# Patient Record
Sex: Male | Born: 1960 | Race: White | Hispanic: No | Marital: Married | Smoking: Never smoker
Health system: Southern US, Community
[De-identification: ages and names within clinical notes are randomized; demographics above are authoritative.]

## PROBLEM LIST (undated history)

## (undated) DIAGNOSIS — I10 Essential (primary) hypertension: Secondary | ICD-10-CM

## (undated) DIAGNOSIS — M519 Unspecified thoracic, thoracolumbar and lumbosacral intervertebral disc disorder: Secondary | ICD-10-CM

## (undated) DIAGNOSIS — K219 Gastro-esophageal reflux disease without esophagitis: Secondary | ICD-10-CM

## (undated) DIAGNOSIS — K317 Polyp of stomach and duodenum: Secondary | ICD-10-CM

## (undated) DIAGNOSIS — D369 Benign neoplasm, unspecified site: Secondary | ICD-10-CM

## (undated) DIAGNOSIS — J302 Other seasonal allergic rhinitis: Secondary | ICD-10-CM

## (undated) DIAGNOSIS — K760 Fatty (change of) liver, not elsewhere classified: Secondary | ICD-10-CM

## (undated) DIAGNOSIS — F419 Anxiety disorder, unspecified: Secondary | ICD-10-CM

## (undated) HISTORY — DX: Fatty (change of) liver, not elsewhere classified: K76.0

## (undated) HISTORY — DX: Benign neoplasm, unspecified site: D36.9

## (undated) HISTORY — DX: Polyp of stomach and duodenum: K31.7

## (undated) HISTORY — DX: Unspecified thoracic, thoracolumbar and lumbosacral intervertebral disc disorder: M51.9

## (undated) HISTORY — DX: Essential (primary) hypertension: I10

## (undated) SURGERY — ERCP, WITH INTERVENTION IF INDICATED
Anesthesia: General | Laterality: Left

---

## 2000-02-06 HISTORY — PX: BACK SURGERY: SHX140

## 2000-03-18 ENCOUNTER — Emergency Department (HOSPITAL_COMMUNITY): Admission: EM | Admit: 2000-03-18 | Discharge: 2000-03-19 | Payer: Self-pay | Admitting: *Deleted

## 2000-04-04 ENCOUNTER — Encounter: Payer: Self-pay | Admitting: Neurosurgery

## 2000-04-04 ENCOUNTER — Ambulatory Visit (HOSPITAL_COMMUNITY): Admission: RE | Admit: 2000-04-04 | Discharge: 2000-04-04 | Payer: Self-pay | Admitting: Neurosurgery

## 2000-05-13 ENCOUNTER — Encounter (HOSPITAL_COMMUNITY): Admission: RE | Admit: 2000-05-13 | Discharge: 2000-06-12 | Payer: Self-pay | Admitting: Neurosurgery

## 2002-10-15 ENCOUNTER — Encounter: Payer: Self-pay | Admitting: Otolaryngology

## 2002-10-15 ENCOUNTER — Ambulatory Visit (HOSPITAL_COMMUNITY): Admission: RE | Admit: 2002-10-15 | Discharge: 2002-10-15 | Payer: Self-pay | Admitting: Otolaryngology

## 2004-09-26 ENCOUNTER — Ambulatory Visit (HOSPITAL_COMMUNITY): Admission: RE | Admit: 2004-09-26 | Discharge: 2004-09-26 | Payer: Self-pay | Admitting: Internal Medicine

## 2005-03-14 ENCOUNTER — Ambulatory Visit (HOSPITAL_COMMUNITY): Admission: RE | Admit: 2005-03-14 | Discharge: 2005-03-14 | Payer: Self-pay | Admitting: Internal Medicine

## 2005-09-19 ENCOUNTER — Ambulatory Visit (HOSPITAL_COMMUNITY): Admission: RE | Admit: 2005-09-19 | Discharge: 2005-09-19 | Payer: Self-pay | Admitting: Internal Medicine

## 2011-03-07 ENCOUNTER — Telehealth (INDEPENDENT_AMBULATORY_CARE_PROVIDER_SITE_OTHER): Payer: Self-pay | Admitting: *Deleted

## 2011-03-07 ENCOUNTER — Other Ambulatory Visit (INDEPENDENT_AMBULATORY_CARE_PROVIDER_SITE_OTHER): Payer: Self-pay | Admitting: *Deleted

## 2011-03-07 DIAGNOSIS — Z1211 Encounter for screening for malignant neoplasm of colon: Secondary | ICD-10-CM

## 2011-03-07 NOTE — Telephone Encounter (Signed)
Patient needs movi prep 

## 2011-03-09 MED ORDER — PEG-KCL-NACL-NASULF-NA ASC-C 100 G PO SOLR: 1.0000 | Freq: Once | ORAL | Status: DC

## 2011-04-24 ENCOUNTER — Telehealth (INDEPENDENT_AMBULATORY_CARE_PROVIDER_SITE_OTHER): Payer: Self-pay | Admitting: *Deleted

## 2011-04-24 NOTE — Telephone Encounter (Signed)
Requesting MD/PCP:  fagan     Name & DOB: Jacob Douglas 07/13/2060     Procedure: TCS  Reason/Indication:  screening  Has patient had this procedure before?  no  If so, when, by whom and where?    Is there a family history of colon cancer?  no  Who?  What age when diagnosed?    Is patient diabetic?   no      Does patient have prosthetic heart valve?  no  Do you have a pacemaker?  no  Has patient had joint replacement within last 12 months?  no  Is patient on Coumadin, Plavix and/or Aspirin? no  Medications: protonix 40 mg daily, lotrel 5/20 mg daily  Allergies: pcn  Pharmacy: [facility]  Medication Adjustment: none  Procedure date & time: 05/17/11 at 830

## 2011-04-26 NOTE — Telephone Encounter (Signed)
agree

## 2011-05-16 ENCOUNTER — Encounter (HOSPITAL_COMMUNITY): Payer: Self-pay | Admitting: Pharmacy Technician

## 2011-05-16 MED ORDER — SODIUM CHLORIDE 0.45 % IV SOLN
Freq: Once | INTRAVENOUS | Status: AC
  Administered 2011-05-17: 08:00:00 via INTRAVENOUS

## 2011-05-17 ENCOUNTER — Encounter (HOSPITAL_COMMUNITY): Payer: Self-pay | Admitting: *Deleted

## 2011-05-17 ENCOUNTER — Encounter (HOSPITAL_COMMUNITY)
Admission: RE | Disposition: A | Discharge status: Home / Self Care | Payer: Self-pay | Source: Ambulatory Visit | Attending: Internal Medicine

## 2011-05-17 ENCOUNTER — Ambulatory Visit (HOSPITAL_COMMUNITY)
Admission: RE | Admit: 2011-05-17 | Discharge: 2011-05-17 | Disposition: A | Discharge status: Home / Self Care | Payer: Managed Care, Other (non HMO) | Source: Ambulatory Visit | Attending: Internal Medicine | Admitting: Internal Medicine

## 2011-05-17 DIAGNOSIS — Z79899 Other long term (current) drug therapy: Secondary | ICD-10-CM | POA: Insufficient documentation

## 2011-05-17 DIAGNOSIS — I10 Essential (primary) hypertension: Secondary | ICD-10-CM | POA: Insufficient documentation

## 2011-05-17 DIAGNOSIS — D126 Benign neoplasm of colon, unspecified: Secondary | ICD-10-CM

## 2011-05-17 DIAGNOSIS — Z1211 Encounter for screening for malignant neoplasm of colon: Secondary | ICD-10-CM | POA: Insufficient documentation

## 2011-05-17 HISTORY — DX: Gastro-esophageal reflux disease without esophagitis: K21.9

## 2011-05-17 HISTORY — PX: COLONOSCOPY: SHX5424

## 2011-05-17 HISTORY — DX: Essential (primary) hypertension: I10

## 2011-05-17 HISTORY — DX: Other seasonal allergic rhinitis: J30.2

## 2011-05-17 SURGERY — COLONOSCOPY
Anesthesia: Moderate Sedation

## 2011-05-17 MED ORDER — MEPERIDINE HCL 50 MG/ML IJ SOLN
INTRAMUSCULAR | Status: DC | PRN
  Administered 2011-05-17 (×2): 25 mg via INTRAVENOUS

## 2011-05-17 MED ORDER — MIDAZOLAM HCL 5 MG/5ML IJ SOLN
INTRAMUSCULAR | Status: AC
  Filled 2011-05-17: qty 10

## 2011-05-17 MED ORDER — STERILE WATER FOR IRRIGATION IR SOLN
Status: DC | PRN
  Administered 2011-05-17: 09:00:00

## 2011-05-17 MED ORDER — MEPERIDINE HCL 50 MG/ML IJ SOLN
INTRAMUSCULAR | Status: AC
  Filled 2011-05-17: qty 1

## 2011-05-17 MED ORDER — MIDAZOLAM HCL 5 MG/5ML IJ SOLN
INTRAMUSCULAR | Status: DC | PRN
  Administered 2011-05-17 (×4): 2 mg via INTRAVENOUS

## 2011-05-17 NOTE — Op Note (Signed)
COLONOSCOPY PROCEDURE REPORT  PATIENT:  Jacob Douglas  MR#:  [id number] Birthdate:  04-18-1960, 51 y.o., male Endoscopist:  Dr. Malissa Hippo, MD Referred By:  Dr. Carylon Perches, MD Procedure Date: 05/17/2011  Procedure:   Colonoscopy with snare polypectomy.  Indications:  Patient is 51 year old Caucasian male who is here for average risk screening colonoscopy.  Informed Consent:  The procedure and risks were reviewed with the patient and informed consent was obtained.  Medications:  Demerol 50 mg IV Versed 8 mg IV  Description of procedure:  After a digital rectal exam was performed, that colonoscope was advanced from the anus through the rectum and colon to the area of the cecum, ileocecal valve and appendiceal orifice. The cecum was deeply intubated. These structures were well-seen and photographed for the record. From the level of the cecum and ileocecal valve, the scope was slowly and cautiously withdrawn. The mucosal surfaces were carefully surveyed utilizing scope tip to flexion to facilitate fold flattening as needed. The scope was pulled down into the rectum where a thorough exam including retroflexion was performed.  Findings:   Prep excellent. About 15 x 10 mm sessile polyp at ileocecal valve. Piecemeal polypectomy performed with removal of 2 pieces and rest of the polyp was coagulated using snare tip. Two small polyps at the ascending colon were coagulated using snare tip. Normal rectal mucosa and anal rectal junction.  Therapeutic/Diagnostic Maneuvers Performed:  See above  Complications:  None  Cecal Withdrawal Time:  22 minutes  Impression:  Examination performed to cecum. 15 x 10 mm sessile polyp at ileocecal valve. Treated with combination of polypectomy and coagulation. Polypectomy complete. Two small polyps at ascending colon are correlated using snare tip.  Recommendations:  Standard instructions given. I will be contacting patient with results of biopsy and  further recommendations.  Zuleyka Kloc U  05/17/2011 9:15 AM  CC: Dr. Carylon Perches, MD, MD & Dr. Bonnetta Barry ref. provider found

## 2011-05-17 NOTE — H&P (Signed)
WILBORN MEMBRENO is an 51 y.o. male.   Chief Complaint: Patient is here for colonoscopy. HPI: Patient is 51 year old Caucasian male who is in for average risk screening colonoscopy. He denies abdominal pain change in bowel habits or rectal bleeding. Family history is negative for colorectal carcinoma.  Past Medical History  Diagnosis Date  . Seasonal allergies   . Hypertension   . GERD (gastroesophageal reflux disease)     Past Surgical History  Procedure Date  . Back surgery 2002    Family History  Problem Relation Age of Onset  . Colon cancer Neg Hx    Social History:  reports that he has never smoked. He does not have any smokeless tobacco history on file. He reports that he drinks about 2 ounces of alcohol per week. He reports that he does not use illicit drugs.  Allergies:  Allergies  Allergen Reactions  . Penicillins Rash    Medications Prior to Admission  Medication Dose Route Frequency Provider Last Rate Last Dose  . 0.45 % sodium chloride infusion   Intravenous Once Malissa Hippo, MD 20 mL/hr at 05/17/11 0755    . meperidine (DEMEROL) 50 MG/ML injection           . midazolam (VERSED) 5 MG/5ML injection            Medications Prior to Admission  Medication Sig Dispense Refill  . ALPRAZolam (XANAX) 0.5 MG tablet Take 0.5 mg by mouth 2 (two) times daily as needed. Patient takes 1/2 tablet if needed, which is very rarely      . amLODipine-benazepril (LOTREL) 5-20 MG per capsule Take 1 capsule by mouth daily.      . Multiple Vitamin (MULITIVITAMIN WITH MINERALS) TABS Take 1 tablet by mouth daily.      . pantoprazole (PROTONIX) 40 MG tablet Take 40 mg by mouth daily.      . peg 3350 powder (MOVIPREP) 100 G SOLR Take 1 kit (100 g total) by mouth once.  1 kit  0    No results found for this or any previous visit (from the past 48 hour(s)). No results found.  Review of Systems  Constitutional: Negative for weight loss.  Gastrointestinal: Negative for abdominal pain,  diarrhea, constipation, blood in stool and melena.    Blood pressure 134/90, pulse 57, temperature 98 F (36.7 C), temperature source Oral, resp. rate 14, height 5\' 9"  (1.753 m), weight 185 lb (83.915 kg), SpO2 94.00%. Physical Exam  Constitutional: He appears well-developed and well-nourished.  HENT:  Mouth/Throat: Oropharynx is clear and moist.  Eyes: Conjunctivae are normal. No scleral icterus.  Neck: No thyromegaly present.  Cardiovascular: Normal rate, regular rhythm and normal heart sounds.   No murmur heard. Respiratory: Effort normal and breath sounds normal.  GI: Soft. He exhibits no distension and no mass. There is no tenderness.  Musculoskeletal: He exhibits no edema.  Lymphadenopathy:    He has no cervical adenopathy.  Neurological: He is alert.  Skin: Skin is warm and dry.     Assessment/Plan Average risk screening colonoscopy  Takyia Sindt U 05/17/2011, 8:34 AM

## 2011-05-17 NOTE — Discharge Instructions (Signed)
Resume usual medications and diet. No aspirin for 10 days. Physician will contact you results of biopsy and further recommendations  Colonoscopy Care After Read the instructions outlined below and refer to this sheet in the next few weeks. These discharge instructions provide you with general information on caring for yourself after you leave the hospital. Your doctor may also give you specific instructions. While your treatment has been planned according to the most current medical practices available, unavoidable complications occasionally occur. If you have any problems or questions after discharge, call your doctor. HOME CARE INSTRUCTIONS ACTIVITY:  You may resume your regular activity, but move at a slower pace for the next 24 hours.   Take frequent rest periods for the next 24 hours.   Walking will help get rid of the air and reduce the bloated feeling in your belly (abdomen).   No driving for 24 hours (because of the medicine (anesthesia) used during the test).   You may shower.   Do not sign any important legal documents or operate any machinery for 24 hours (because of the anesthesia used during the test).  NUTRITION:  Drink plenty of fluids.   You may resume your normal diet as instructed by your doctor.   Begin with a light meal and progress to your normal diet. Heavy or fried foods are harder to digest and may make you feel sick to your stomach (nauseated).   Avoid alcoholic beverages for 24 hours or as instructed.  MEDICATIONS:  You may resume your normal medications unless your doctor tells you otherwise.  WHAT TO EXPECT TODAY:  Some feelings of bloating in the abdomen.   Passage of more gas than usual.   Spotting of blood in your stool or on the toilet paper.  IF YOU HAD POLYPS REMOVED DURING THE COLONOSCOPY:  No aspirin products for 7 days or as instructed.   No alcohol for 7 days or as instructed.   Eat a soft diet for the next 24 hours.  FINDING OUT  THE RESULTS OF YOUR TEST Not all test results are available during your visit. If your test results are not back during the visit, make an appointment with your caregiver to find out the results. Do not assume everything is normal if you have not heard from your caregiver or the medical facility. It is important for you to follow up on all of your test results.  SEEK IMMEDIATE MEDICAL CARE IF:  You have more than a spotting of blood in your stool.   Your belly is swollen (abdominal distention).   You are nauseated or vomiting.   You have a fever.   You have abdominal pain or discomfort that is severe or gets worse throughout the day.  Document Released: 09/06/2003 Document Revised: 01/11/2011 Document Reviewed: 09/04/2007 [facility] Patient Information 2012 [place], [place].

## 2011-05-22 ENCOUNTER — Encounter (INDEPENDENT_AMBULATORY_CARE_PROVIDER_SITE_OTHER): Payer: Self-pay | Admitting: *Deleted

## 2011-05-23 ENCOUNTER — Encounter (HOSPITAL_COMMUNITY): Payer: Self-pay | Admitting: Internal Medicine

## 2011-06-25 ENCOUNTER — Emergency Department (HOSPITAL_COMMUNITY): Payer: Managed Care, Other (non HMO)

## 2011-06-25 ENCOUNTER — Encounter (HOSPITAL_COMMUNITY): Payer: Self-pay

## 2011-06-25 ENCOUNTER — Emergency Department (HOSPITAL_COMMUNITY)
Admission: EM | Admit: 2011-06-25 | Discharge: 2011-06-25 | Disposition: A | Discharge status: Home / Self Care | Payer: Managed Care, Other (non HMO) | Attending: Emergency Medicine | Admitting: Emergency Medicine

## 2011-06-25 DIAGNOSIS — R0789 Other chest pain: Secondary | ICD-10-CM

## 2011-06-25 DIAGNOSIS — K219 Gastro-esophageal reflux disease without esophagitis: Secondary | ICD-10-CM | POA: Insufficient documentation

## 2011-06-25 DIAGNOSIS — R110 Nausea: Secondary | ICD-10-CM | POA: Insufficient documentation

## 2011-06-25 DIAGNOSIS — I10 Essential (primary) hypertension: Secondary | ICD-10-CM | POA: Insufficient documentation

## 2011-06-25 DIAGNOSIS — R079 Chest pain, unspecified: Secondary | ICD-10-CM | POA: Insufficient documentation

## 2011-06-25 DIAGNOSIS — M549 Dorsalgia, unspecified: Secondary | ICD-10-CM | POA: Insufficient documentation

## 2011-06-25 LAB — CBC
Hemoglobin: 15 g/dL (ref 13.0–17.0)
MCH: 30 pg (ref 26.0–34.0)
MCHC: 33.1 g/dL (ref 30.0–36.0)
RDW: 13 % (ref 11.5–15.5)

## 2011-06-25 LAB — DIFFERENTIAL
Basophils Absolute: 0.1 10*3/uL (ref 0.0–0.1)
Basophils Relative: 1 % (ref 0–1)
Eosinophils Absolute: 0.1 10*3/uL (ref 0.0–0.7)
Monocytes Absolute: 1.1 10*3/uL — ABNORMAL HIGH (ref 0.1–1.0)
Monocytes Relative: 11 % (ref 3–12)
Neutrophils Relative %: 53 % (ref 43–77)

## 2011-06-25 LAB — BASIC METABOLIC PANEL
Calcium: 9.6 mg/dL (ref 8.4–10.5)
GFR calc Af Amer: >90 mL/min (ref >90)
GFR calc non Af Amer: 85 mL/min — ABNORMAL LOW (ref >90)
Glucose, Bld: 111 mg/dL — ABNORMAL HIGH (ref 70–99)
Potassium: 3.6 mEq/L (ref 3.5–5.1)
Sodium: 140 mEq/L (ref 135–145)

## 2011-06-25 MED ORDER — SODIUM CHLORIDE 0.9 % IV SOLN
Freq: Once | INTRAVENOUS | Status: AC
  Administered 2011-06-25: 04:00:00 via INTRAVENOUS

## 2011-06-25 MED ORDER — IBUPROFEN 800 MG PO TABS: 800.0000 mg | ORAL_TABLET | Freq: Three times a day (TID) | ORAL | Status: AC

## 2011-06-25 MED ORDER — HYDROCODONE-ACETAMINOPHEN 5-325 MG PO TABS: 1.0000 | ORAL_TABLET | ORAL | Status: AC | PRN

## 2011-06-25 MED ORDER — ASPIRIN 81 MG PO CHEW
324.0000 mg | CHEWABLE_TABLET | Freq: Once | ORAL | Status: AC
  Administered 2011-06-25: 324 mg via ORAL
  Filled 2011-06-25: qty 4

## 2011-06-25 MED ORDER — MORPHINE SULFATE 4 MG/ML IJ SOLN
2.0000 mg | Freq: Once | INTRAMUSCULAR | Status: AC
  Administered 2011-06-25: 2 mg via INTRAVENOUS
  Filled 2011-06-25: qty 1

## 2011-06-25 MED ORDER — ONDANSETRON HCL 4 MG/2ML IJ SOLN
4.0000 mg | Freq: Once | INTRAMUSCULAR | Status: AC
  Administered 2011-06-25: 4 mg via INTRAVENOUS
  Filled 2011-06-25: qty 2

## 2011-06-25 NOTE — ED Notes (Signed)
Patient denies pain and is resting comfortably.  

## 2011-06-25 NOTE — Discharge Instructions (Signed)
Your blood work was normal here tonight including your heart numbers. Your EKG and chest xray were normal. Apply heat to the area for comfort. Use the ibuprofen and the stronger pain medicine if needed. Follow up with your doctor.   Chest Pain (Nonspecific) Chest pain has many causes. Your pain could be caused by something serious, such as a heart attack or a blood clot in the lungs. It could also be caused by something less serious, such as a chest bruise or a virus. Follow up with your doctor. More lab tests or other studies may be needed to find the cause of your pain. Most of the time, nonspecific chest pain will improve within 2 to 3 days of rest and mild pain medicine. HOME CARE  For chest bruises, you may put ice on the sore area for 15 to 20 minutes, 3 to 4 times a day. Do this only if it makes you or your child feel better.   Put ice in a plastic bag.   Place a towel between the skin and the bag.   Rest for the next 2 to 3 days.   Go back to work if the pain improves.   See your doctor if the pain lasts longer than 1 to 2 weeks.   Only take medicine as told by your doctor.   Quit smoking if you smoke.  GET HELP RIGHT AWAY IF:   There is more pain or pain that spreads to the arm, neck, jaw, back, or belly (abdomen).   You or your child has shortness of breath.   You or your child coughs more than usual or coughs up blood.   You or your child has very bad back or belly pain, feels sick to his or her stomach (nauseous), or throws up (vomits).   You or your child has very bad weakness.   You or your child passes out (faints).   You or your child has a temperature by mouth above 102 F (38.9 C), not controlled by medicine.  Any of these problems may be serious and may be an emergency. Do not wait to see if the problems will go away. Get medical help right away. Call your local emergency services 911 in [place] Do not drive yourself to the hospital. MAKE SURE YOU:    Understand these instructions.   Will watch this condition.   Will get help right away if you or your child is not doing well or gets worse.  Document Released: 07/11/2007 Document Revised: 01/11/2011 Document Reviewed: 07/11/2007 [facility] Patient Information 2012 [place], [place].

## 2011-06-25 NOTE — ED Notes (Signed)
Pt c/o chest pain that woke him up 1hr PTA also has complaints of SOB and nausea. Pain moves thru to back

## 2011-06-25 NOTE — ED Provider Notes (Signed)
History     CSN: [id number]  Arrival date & time 06/25/11  4098   First MD Initiated Contact with Patient 06/25/11 [phone]      Chief Complaint  Patient presents with  . Chest Pain    (Consider location/radiation/quality/duration/timing/severity/associated sxs/prior treatment) HPI  Jacob Douglas is a 51 y.o. male who presents to the Emergency Department complaining of  Right sided chest pain that woke him from sleep associated with nausea. Pain radiated to his back. He has a history of GERD and is on protonx. He took Prilosec without relief.The only physical activity he can recall in the last 24 hours was pulling weeds in his yard for 2 hours yesterday. Denies fever, chills, cough,shortness of breath, diarrhea.  PCP Dr. Ouida Sills Past Medical History  Diagnosis Date  . Seasonal allergies   . Hypertension   . GERD (gastroesophageal reflux disease)     Past Surgical History  Procedure Date  . Back surgery 2002  . Colonoscopy 05/17/2011    Procedure: COLONOSCOPY;  Surgeon: Malissa Hippo, MD;  Location: [facility];  Service: Endoscopy;  Laterality: N/A;  830    Family History  Problem Relation Age of Onset  . Colon cancer Neg Hx     History  Substance Use Topics  . Smoking status: Never Smoker   . Smokeless tobacco: Not on file  . Alcohol Use: 2.0 oz/week    4 drink(s) per week      Review of Systems  Constitutional: Negative for fever.       10 Systems reviewed and are negative for acute change except as noted in the HPI.  HENT: Negative for congestion.   Eyes: Negative for discharge and redness.  Respiratory: Negative for cough and shortness of breath.   Cardiovascular: Positive for chest pain.  Gastrointestinal: Positive for nausea. Negative for vomiting and abdominal pain.  Musculoskeletal: Negative for back pain.  Skin: Negative for rash.  Neurological: Negative for syncope, numbness and headaches.  Psychiatric/Behavioral:       No behavior change.     Allergies  Penicillins  Home Medications   Current Outpatient Rx  Name Route Sig Dispense Refill  . ALPRAZOLAM 0.5 MG PO TABS Oral Take 0.5 mg by mouth 2 (two) times daily as needed. Patient takes 1/2 tablet if needed, which is very rarely    . AMLODIPINE BESY-BENAZEPRIL HCL 5-20 MG PO CAPS Oral Take 1 capsule by mouth daily.    . ADULT MULTIVITAMIN W/MINERALS CH Oral Take 1 tablet by mouth daily.    Marland Kitchen PANTOPRAZOLE SODIUM 40 MG PO TBEC Oral Take 40 mg by mouth daily.      BP 156/106  Temp(Src) 98 F (36.7 C) (Oral)  Resp 16  Ht 5\' 9"  (1.753 m)  Wt 185 lb (83.915 kg)  BMI 27.32 kg/m2  SpO2 96%  Physical Exam  Nursing note and vitals reviewed. Constitutional:       Awake, alert, nontoxic appearance.  HENT:  Head: Atraumatic.  Eyes: Right eye exhibits no discharge. Left eye exhibits no discharge.  Neck: Neck supple.  Cardiovascular: Normal rate, normal heart sounds and intact distal pulses.   Pulmonary/Chest: Effort normal. No respiratory distress. He has no wheezes. He exhibits no tenderness.  Abdominal: Soft. There is no tenderness. There is no rebound.  Musculoskeletal: He exhibits no tenderness.       Baseline ROM, no obvious new focal weakness.  Neurological:       Mental status and motor strength appears baseline  for patient and situation.  Skin: No rash noted.  Psychiatric: He has a normal mood and affect.    ED Course  Procedures (including critical care time)   Date: 06/25/2011  0344  Rate:70 Rhythm: normal sinus rhythm with sinus arrhythmia  QRS Axis: normal  Intervals: normal  ST/T Wave abnormalities: normal  Conduction Disutrbances: none  Narrative Interpretation: unremarkable  Results for orders placed during the hospital encounter of 06/25/11  CBC      Component Value Range   WBC 10.3  4.0 - 10.5 (K/uL)   RBC 5.00  4.22 - 5.81 (MIL/uL)   Hemoglobin 15.0  13.0 - 17.0 (g/dL)   HCT 16.1  09.6 - 04.5 (%)   MCV 90.6  78.0 - 100.0 (fL)   MCH 30.0   26.0 - 34.0 (pg)   MCHC 33.1  30.0 - 36.0 (g/dL)   RDW 40.9  81.1 - 91.4 (%)   Platelets 346  150 - 400 (K/uL)  BASIC METABOLIC PANEL      Component Value Range   Sodium 140  135 - 145 (mEq/L)   Potassium 3.6  3.5 - 5.1 (mEq/L)   Chloride 101  96 - 112 (mEq/L)   CO2 25  19 - 32 (mEq/L)   Glucose, Bld 111 (*) 70 - 99 (mg/dL)   BUN 14  6 - 23 (mg/dL)   Creatinine, Ser 7.82  0.50 - 1.35 (mg/dL)   Calcium 9.6  8.4 - 95.6 (mg/dL)   GFR calc non Af Amer 85 (*) >90 (mL/min)   GFR calc Af Amer >90  >90 (mL/min)  TROPONIN I      Component Value Range   Troponin I <0.30  <0.30 (ng/mL)  DIFFERENTIAL      Component Value Range   Neutrophils Relative 53  43 - 77 (%)   Neutro Abs 5.4  1.7 - 7.7 (K/uL)   Lymphocytes Relative 35  12 - 46 (%)   Lymphs Abs 3.6  0.7 - 4.0 (K/uL)   Monocytes Relative 11  3 - 12 (%)   Monocytes Absolute 1.1 (*) 0.1 - 1.0 (K/uL)   Eosinophils Relative 1  0 - 5 (%)   Eosinophils Absolute 0.1  0.0 - 0.7 (K/uL)   Basophils Relative 1  0 - 1 (%)   Basophils Absolute 0.1  0.0 - 0.1 (K/uL)  TROPONIN I      Component Value Range   Troponin I <0.30  <0.30 (ng/mL)   Dg Chest Port 1 View  06/25/2011  *RADIOLOGY REPORT*  Clinical Data: Chest pain, shortness of breath.  PORTABLE CHEST - 1 VIEW  Comparison: None.  Findings: Heart size upper normal limits to mildly enlarged. Mediastinal contours otherwise within normal range for portable technique.  Minimal left lung base opacity.  Otherwise, no focal consolidation, pleural effusion, or pneumothorax.  No acute osseous finding.  IMPRESSION: Heart size upper normal limits to mildly enlarged.  Minimal left lung base opacity is favored to represent atelectasis or scarring.  Original Report Authenticated By: Waneta Martins, M.D.    MDM   Patient who awakened with right-sided chest pain. Chest x-ray is negative for any acute findings. Troponin x2 are negative. EKG is normal. Labs are unremarkable.patient was given analgesics and  anti-medic with relief of his discomfort. He has ambulated in the department and had by mouth fluids.Dx testing d/w pt and wife.  Questions answered.  Verb understanding, agreeable to d/c home with outpt f/u. Pt stable in ED with no significant  deterioration in condition.The patient appears reasonably screened and/or stabilized for discharge and I doubt any other medical condition or other [facility] requiring further screening, evaluation, or treatment in the ED at this time prior to discharge.  MDM Reviewed: nursing note and vitals Interpretation: labs, ECG and x-ray          Nicoletta Dress. Colon Branch, MD 06/25/11 1610

## 2012-03-25 ENCOUNTER — Other Ambulatory Visit (HOSPITAL_COMMUNITY): Payer: Self-pay | Admitting: Internal Medicine

## 2012-03-25 ENCOUNTER — Ambulatory Visit (HOSPITAL_COMMUNITY)
Admission: RE | Admit: 2012-03-25 | Discharge: 2012-03-25 | Disposition: A | Discharge status: Home / Self Care | Payer: Managed Care, Other (non HMO) | Source: Ambulatory Visit | Attending: Internal Medicine | Admitting: Internal Medicine

## 2012-03-25 DIAGNOSIS — R109 Unspecified abdominal pain: Secondary | ICD-10-CM | POA: Insufficient documentation

## 2012-03-25 DIAGNOSIS — K8020 Calculus of gallbladder without cholecystitis without obstruction: Secondary | ICD-10-CM | POA: Insufficient documentation

## 2012-03-25 DIAGNOSIS — K7689 Other specified diseases of liver: Secondary | ICD-10-CM | POA: Insufficient documentation

## 2012-03-27 NOTE — Patient Instructions (Signed)
Jacob Douglas  03/27/2012   Your procedure is scheduled on:  03/31/12  Report to [facility] at 0915 AM.  Call this number if you have problems the morning of surgery: [phone]   Remember:   Do not eat food or drink liquids after midnight.   Take these medicines the morning of surgery with A SIP OF WATER: xanax, lotrel, protonix   Do not wear jewelry, make-up or nail polish.  Do not wear lotions, powders, or perfumes. You may wear deodorant.  Do not shave 48 hours prior to surgery. Men may shave face and neck.  Do not bring valuables to the hospital.  Contacts, dentures or bridgework may not be worn into surgery.  Leave suitcase in the car. After surgery it may be brought to your room.  For patients admitted to the hospital, checkout time is 11:00 AM the day of  discharge.   Patients discharged the day of surgery will not be allowed to drive  home.  Name and phone number of your driver: family  Special Instructions: Shower using CHG 2 nights before surgery and the night before surgery.  If you shower the day of surgery use CHG.  Use special wash - you have one bottle of CHG for all showers.  You should use approximately 1/3 of the bottle for each shower.   Please read over the following fact sheets that you were given: Pain Booklet, MRSA Information, Surgical Site Infection Prevention, Anesthesia Post-op Instructions and Care and Recovery After Surgery   PATIENT INSTRUCTIONS POST-ANESTHESIA  IMMEDIATELY FOLLOWING SURGERY:  Do not drive or operate machinery for the first twenty four hours after surgery.  Do not make any important decisions for twenty four hours after surgery or while taking narcotic pain medications or sedatives.  If you develop intractable nausea and vomiting or a severe headache please notify your doctor immediately.  FOLLOW-UP:  Please make an appointment with your surgeon as instructed. You do not need to follow up with anesthesia unless specifically instructed to do  so.  WOUND CARE INSTRUCTIONS (if applicable):  Keep a dry clean dressing on the anesthesia/puncture wound site if there is drainage.  Once the wound has quit draining you may leave it open to air.  Generally you should leave the bandage intact for twenty four hours unless there is drainage.  If the epidural site drains for more than 36-48 hours please call the anesthesia department.  QUESTIONS?:  Please feel free to call your physician or the hospital operator if you have any questions, and they will be happy to assist you.      Laparoscopic Cholecystectomy Laparoscopic cholecystectomy is surgery to remove the gallbladder. The gallbladder is located slightly to the right of center in the abdomen, behind the liver. It is a concentrating and storage sac for the bile produced in the liver. Bile aids in the digestion and absorption of fats. Gallbladder disease (cholecystitis) is an inflammation of your gallbladder. This condition is usually caused by a buildup of gallstones (cholelithiasis) in your gallbladder. Gallstones can block the flow of bile, resulting in inflammation and pain. In severe cases, emergency surgery may be required. When emergency surgery is not required, you will have time to prepare for the procedure. Laparoscopic surgery is an alternative to open surgery. Laparoscopic surgery usually has a shorter recovery time. Your common bile duct may also need to be examined and explored. Your caregiver will discuss this with you if he or she feels this should be done.  If stones are found in the common bile duct, they may be removed. LET YOUR CAREGIVER KNOW ABOUT:  Allergies to food or medicine.  Medicines taken, including vitamins, herbs, eyedrops, over-the-counter medicines, and creams.  Use of steroids (by mouth or creams).  Previous problems with anesthetics or numbing medicines.  History of bleeding problems or blood clots.  Previous surgery.  Other health problems, including  diabetes and kidney problems.  Possibility of pregnancy, if this applies. RISKS AND COMPLICATIONS All surgery is associated with risks. Some problems that may occur following this procedure include:  Infection.  Damage to the common bile duct, nerves, arteries, veins, or other internal organs such as the stomach or intestines.  Bleeding.  A stone may remain in the common bile duct. BEFORE THE PROCEDURE  Do not take aspirin for 3 days prior to surgery or blood thinners for 1 week prior to surgery.  Do not eat or drink anything after midnight the night before surgery.  Let your caregiver know if you develop a cold or other infectious problem prior to surgery.  You should be present 60 minutes before the procedure or as directed. PROCEDURE  You will be given medicine that makes you sleep (general anesthetic). When you are asleep, your surgeon will make several small cuts (incisions) in your abdomen. One of these incisions is used to insert a small, lighted scope (laparoscope) into the abdomen. The laparoscope helps the surgeon see into your abdomen. Carbon dioxide gas will be pumped into your abdomen. The gas allows more room for the surgeon to perform your surgery. Other operating instruments are inserted through the other incisions. Laparoscopic procedures may not be appropriate when:  There is major scarring from previous surgery.  The gallbladder is extremely inflamed.  There are bleeding disorders or unexpected cirrhosis of the liver.  A pregnancy is near term.  Other conditions make the laparoscopic procedure impossible. If your surgeon feels it is not safe to continue with a laparoscopic procedure, he or she will perform an open abdominal procedure. In this case, the surgeon will make an incision to open the abdomen. This gives the surgeon a larger view and field to work within. This may allow the surgeon to perform procedures that sometimes cannot be performed with a  laparoscope alone. Open surgery has a longer recovery time. AFTER THE PROCEDURE  You will be taken to the recovery area where a nurse will watch and check your progress.  You may be allowed to go home the same day.  Do not resume physical activities until directed by your caregiver.  You may resume a normal diet and activities as directed. Document Released: 01/22/2005 Document Revised: 04/16/2011 Document Reviewed: 07/07/2010 [facility] Patient Information 2013 [place], [place].

## 2012-03-27 NOTE — H&P (Signed)
  NTS SOAP Note  Vital Signs:  Vitals as of: 03/27/2012: Systolic 141: Diastolic 88: Heart Rate 77: Temp 98.23F: Height 67ft 9in: Weight 194Lbs 0 Ounces: BMI 29  BMI : 28.65 kg/m2  Subjective: This 52 Years 10 Months old Male presents forsymptoms of   biliary colic.  Has had multiple episodes of right upper quadrant abdominal pain with radiation to the right flank, nausea, and bloating over the past year.  Made worse with fatty foods.  U/S of gallbladder reveals cholelithiasis, normal common bile duct.  No fever, chills, jaundice.  Review of Symptoms:  Constitutional:unremarkable   Head:unremarkable    Eyes:unremarkable   Nose/Mouth/Throat:unremarkable Cardiovascular:  unremarkable   Respiratory:unremarkable   Gastrointestin    abdominal pain,heartburn Genitourinary:unremarkable     Musculoskeletal:unremarkable   Skin:unremarkable Hematolgic/Lymphatic:unremarkable     Allergic/Immunologic:unremarkable     Past Medical History:    Reviewed   Past Medical History  Surgical History: back surgery Medical Problems:  High Blood pressure Allergies: pcn Medications: lotrell, protonix   Social History:Reviewed  Social History  Preferred Language: English Race:  White Ethnicity: Other Age: 52 Years 4 Months Marital Status:  M Alcohol: socially Recreational drug(s):  No   Smoking Status: Never smoker reviewed on 03/27/2012 Functional Status reviewed on mm/dd/yyyy ------------------------------------------------ Bathing: Normal Cooking: Normal Dressing: Normal Driving: Normal Eating: Normal Managing Meds: Normal Oral Care: Normal Shopping: Normal Toileting: Normal Transferring: Normal Walking: Normal Cognitive Status reviewed on mm/dd/yyyy ------------------------------------------------ Attention: Normal Decision Making: Normal Language: Normal Memory: Normal Motor: Normal Perception: Normal Problem Solving:  Normal Visual and Spatial: Normal   Family History:  Reviewed   Family History  Is there a family history of:Both grandparents with cancer, CAD    Objective Information: General:  Well appearing, well nourished in no distress.   no scleral icterus Heart:  RRR, no murmur Lungs:    CTA bilaterally, no wheezes, rhonchi, rales.  Breathing unlabored. Abdomen:Soft, NT/ND, no HSM, no masses.  Umbilical hernia present.  Assessment:Biliary colic, cholelithiasis, umbilical hernia  Diagnosis &amp; Procedure:    Plan:Scjheduled for laparoscopic cholecystectomy, umbilical herniorrhaphy on 03/31/12.   Patient Education:Alternative treatments to surgery were discussed with patient (and family).  Risks and benefits  of procedure including bleeding, infection, hepatobiliary injury, and the possibility of an open procedure were fully explained to the patient (and family) who gave informed consent. Patient/family questions were addressed.  Follow-up:Pending Surgery

## 2012-03-28 ENCOUNTER — Encounter (HOSPITAL_COMMUNITY)
Admission: RE | Admit: 2012-03-28 | Discharge: 2012-03-28 | Disposition: A | Discharge status: Home / Self Care | Payer: Managed Care, Other (non HMO) | Source: Ambulatory Visit | Attending: General Surgery | Admitting: General Surgery

## 2012-03-28 ENCOUNTER — Encounter (HOSPITAL_COMMUNITY): Payer: Self-pay | Admitting: Pharmacy Technician

## 2012-03-28 ENCOUNTER — Encounter (HOSPITAL_COMMUNITY): Payer: Self-pay

## 2012-03-28 HISTORY — DX: Anxiety disorder, unspecified: F41.9

## 2012-03-28 LAB — CBC WITH DIFFERENTIAL/PLATELET
Basophils Absolute: 0 K/uL (ref 0.0–0.1)
Basophils Relative: 0 % (ref 0–1)
Eosinophils Absolute: 0.1 K/uL (ref 0.0–0.7)
Eosinophils Relative: 1 % (ref 0–5)
HCT: 44.7 % (ref 39.0–52.0)
Hemoglobin: 15.1 g/dL (ref 13.0–17.0)
Lymphocytes Relative: 30 % (ref 12–46)
Lymphs Abs: 2.4 K/uL (ref 0.7–4.0)
MCH: 30.7 pg (ref 26.0–34.0)
MCHC: 33.8 g/dL (ref 30.0–36.0)
MCV: 90.9 fL (ref 78.0–100.0)
Monocytes Absolute: 0.8 K/uL (ref 0.1–1.0)
Monocytes Relative: 10 % (ref 3–12)
Neutro Abs: 4.7 K/uL (ref 1.7–7.7)
Neutrophils Relative %: 59 % (ref 43–77)
Platelets: 326 K/uL (ref 150–400)
RBC: 4.92 MIL/uL (ref 4.22–5.81)
RDW: 12.5 % (ref 11.5–15.5)
WBC: 8 K/uL (ref 4.0–10.5)

## 2012-03-28 LAB — BASIC METABOLIC PANEL WITH GFR
BUN: 15 mg/dL (ref 6–23)
CO2: 24 meq/L (ref 19–32)
Calcium: 9.8 mg/dL (ref 8.4–10.5)
Chloride: 103 meq/L (ref 96–112)
Creatinine, Ser: 0.85 mg/dL (ref 0.50–1.35)
GFR calc Af Amer: >90 mL/min
GFR calc non Af Amer: >90 mL/min
Glucose, Bld: 123 mg/dL — ABNORMAL HIGH (ref 70–99)
Potassium: 4.2 meq/L (ref 3.5–5.1)
Sodium: 139 meq/L (ref 135–145)

## 2012-03-28 LAB — HEPATIC FUNCTION PANEL
ALT: 76 U/L — ABNORMAL HIGH (ref 0–53)
AST: 37 U/L (ref 0–37)
Albumin: 4.3 g/dL (ref 3.5–5.2)
Alkaline Phosphatase: 110 U/L (ref 39–117)
Bilirubin, Direct: 0.2 mg/dL (ref 0.0–0.3)
Indirect Bilirubin: 0.8 mg/dL (ref 0.3–0.9)
Total Bilirubin: 1 mg/dL (ref 0.3–1.2)
Total Protein: 7 g/dL (ref 6.0–8.3)

## 2012-03-28 LAB — SURGICAL PCR SCREEN: Staphylococcus aureus: POSITIVE — AB

## 2012-03-31 ENCOUNTER — Encounter (HOSPITAL_COMMUNITY): Payer: Self-pay | Admitting: Anesthesiology

## 2012-03-31 ENCOUNTER — Encounter (HOSPITAL_COMMUNITY): Payer: Self-pay | Admitting: *Deleted

## 2012-03-31 ENCOUNTER — Observation Stay (HOSPITAL_COMMUNITY)
Admission: RE | Admit: 2012-03-31 | Discharge: 2012-03-31 | Disposition: A | Discharge status: Home / Self Care | Payer: Managed Care, Other (non HMO) | Source: Ambulatory Visit | Attending: General Surgery | Admitting: General Surgery

## 2012-03-31 ENCOUNTER — Encounter (HOSPITAL_COMMUNITY)
Admission: RE | Disposition: A | Discharge status: Home / Self Care | Payer: Self-pay | Source: Ambulatory Visit | Attending: General Surgery

## 2012-03-31 ENCOUNTER — Ambulatory Visit (HOSPITAL_COMMUNITY): Payer: Managed Care, Other (non HMO) | Admitting: Anesthesiology

## 2012-03-31 DIAGNOSIS — K429 Umbilical hernia without obstruction or gangrene: Secondary | ICD-10-CM | POA: Insufficient documentation

## 2012-03-31 DIAGNOSIS — K8010 Calculus of gallbladder with chronic cholecystitis without obstruction: Principal | ICD-10-CM | POA: Insufficient documentation

## 2012-03-31 DIAGNOSIS — I10 Essential (primary) hypertension: Secondary | ICD-10-CM | POA: Insufficient documentation

## 2012-03-31 HISTORY — PX: UMBILICAL HERNIA REPAIR: SHX196

## 2012-03-31 HISTORY — PX: CHOLECYSTECTOMY: SHX55

## 2012-03-31 SURGERY — LAPAROSCOPIC CHOLECYSTECTOMY
Anesthesia: General | Site: Abdomen | Wound class: Contaminated

## 2012-03-31 MED ORDER — BUPIVACAINE HCL (PF) 0.5 % IJ SOLN
INTRAMUSCULAR | Status: AC
  Filled 2012-03-31: qty 30

## 2012-03-31 MED ORDER — KETOROLAC TROMETHAMINE 30 MG/ML IJ SOLN
30.0000 mg | Freq: Once | INTRAMUSCULAR | Status: AC
  Administered 2012-03-31: 30 mg via INTRAVENOUS

## 2012-03-31 MED ORDER — ENOXAPARIN SODIUM 40 MG/0.4ML ~~LOC~~ SOLN
SUBCUTANEOUS | Status: AC
  Filled 2012-03-31: qty 0.4

## 2012-03-31 MED ORDER — ATROPINE SULFATE 0.4 MG/ML IJ SOLN
INTRAMUSCULAR | Status: DC | PRN
  Administered 2012-03-31: 0.4 mg via INTRAVENOUS

## 2012-03-31 MED ORDER — GLYCOPYRROLATE 0.2 MG/ML IJ SOLN
INTRAMUSCULAR | Status: DC | PRN
  Administered 2012-03-31: 0.2 mg via INTRAVENOUS
  Administered 2012-03-31: .6 mg via INTRAVENOUS

## 2012-03-31 MED ORDER — LIDOCAINE HCL (PF) 1 % IJ SOLN
INTRAMUSCULAR | Status: AC
  Filled 2012-03-31: qty 5

## 2012-03-31 MED ORDER — ROCURONIUM BROMIDE 50 MG/5ML IV SOLN
INTRAVENOUS | Status: AC
  Filled 2012-03-31: qty 1

## 2012-03-31 MED ORDER — SUCCINYLCHOLINE CHLORIDE 20 MG/ML IJ SOLN
INTRAMUSCULAR | Status: AC
  Filled 2012-03-31: qty 1

## 2012-03-31 MED ORDER — LIDOCAINE HCL (CARDIAC) 20 MG/ML IV SOLN
INTRAVENOUS | Status: DC | PRN
  Administered 2012-03-31: 40 mg via INTRAVENOUS

## 2012-03-31 MED ORDER — OXYCODONE-ACETAMINOPHEN 7.5-325 MG PO TABS: 1.0000 | ORAL_TABLET | ORAL | Status: DC | PRN

## 2012-03-31 MED ORDER — CLINDAMYCIN PHOSPHATE 900 MG/50ML IV SOLN
INTRAVENOUS | Status: AC
  Filled 2012-03-31: qty 50

## 2012-03-31 MED ORDER — ATROPINE SULFATE 1 MG/ML IJ SOLN
INTRAMUSCULAR | Status: AC
  Filled 2012-03-31: qty 1

## 2012-03-31 MED ORDER — PROPOFOL 10 MG/ML IV EMUL
INTRAVENOUS | Status: AC
  Filled 2012-03-31: qty 20

## 2012-03-31 MED ORDER — MIDAZOLAM HCL 2 MG/2ML IJ SOLN
1.0000 mg | INTRAMUSCULAR | Status: DC | PRN
  Administered 2012-03-31 (×2): 2 mg via INTRAVENOUS

## 2012-03-31 MED ORDER — CLINDAMYCIN PHOSPHATE 900 MG/50ML IV SOLN
900.0000 mg | Freq: Once | INTRAVENOUS | Status: AC
  Administered 2012-03-31: 900 mg via INTRAVENOUS

## 2012-03-31 MED ORDER — FENTANYL CITRATE 0.05 MG/ML IJ SOLN
INTRAMUSCULAR | Status: DC | PRN
  Administered 2012-03-31 (×4): 50 ug via INTRAVENOUS

## 2012-03-31 MED ORDER — ONDANSETRON HCL 4 MG/2ML IJ SOLN
INTRAMUSCULAR | Status: AC
  Filled 2012-03-31: qty 2

## 2012-03-31 MED ORDER — NEOSTIGMINE METHYLSULFATE 1 MG/ML IJ SOLN
INTRAMUSCULAR | Status: DC | PRN
  Administered 2012-03-31: 3 mg via INTRAVENOUS

## 2012-03-31 MED ORDER — ONDANSETRON HCL 4 MG/2ML IJ SOLN
4.0000 mg | Freq: Once | INTRAMUSCULAR | Status: AC
  Administered 2012-03-31: 4 mg via INTRAVENOUS

## 2012-03-31 MED ORDER — FENTANYL CITRATE 0.05 MG/ML IJ SOLN: 25.0000 ug | INTRAMUSCULAR | Status: DC | PRN

## 2012-03-31 MED ORDER — LACTATED RINGERS IV SOLN
INTRAVENOUS | Status: DC
  Administered 2012-03-31: 1000 mL via INTRAVENOUS
  Administered 2012-03-31: 12:00:00 via INTRAVENOUS

## 2012-03-31 MED ORDER — SODIUM CHLORIDE 0.9 % IR SOLN
Status: DC | PRN
  Administered 2012-03-31: 1000 mL

## 2012-03-31 MED ORDER — MIDAZOLAM HCL 2 MG/2ML IJ SOLN
INTRAMUSCULAR | Status: AC
  Filled 2012-03-31: qty 2

## 2012-03-31 MED ORDER — ATROPINE SULFATE 0.4 MG/ML IJ SOLN: INTRAMUSCULAR | Status: DC | PRN

## 2012-03-31 MED ORDER — BUPIVACAINE HCL (PF) 0.5 % IJ SOLN
INTRAMUSCULAR | Status: DC | PRN
  Administered 2012-03-31: 10 mL

## 2012-03-31 MED ORDER — KETOROLAC TROMETHAMINE 30 MG/ML IJ SOLN
INTRAMUSCULAR | Status: AC
  Filled 2012-03-31: qty 1

## 2012-03-31 MED ORDER — ENOXAPARIN SODIUM 40 MG/0.4ML ~~LOC~~ SOLN
40.0000 mg | Freq: Once | SUBCUTANEOUS | Status: AC
  Administered 2012-03-31: 40 mg via SUBCUTANEOUS

## 2012-03-31 MED ORDER — CHLORHEXIDINE GLUCONATE 4 % EX LIQD: 1.0000 "application " | Freq: Once | CUTANEOUS | Status: DC

## 2012-03-31 MED ORDER — PROPOFOL 10 MG/ML IV BOLUS
INTRAVENOUS | Status: DC | PRN
  Administered 2012-03-31: 160 mg via INTRAVENOUS

## 2012-03-31 MED ORDER — ONDANSETRON HCL 4 MG/2ML IJ SOLN: 4.0000 mg | Freq: Once | INTRAMUSCULAR | Status: DC | PRN

## 2012-03-31 MED ORDER — GLYCOPYRROLATE 0.2 MG/ML IJ SOLN
INTRAMUSCULAR | Status: AC
  Filled 2012-03-31: qty 3

## 2012-03-31 MED ORDER — FENTANYL CITRATE 0.05 MG/ML IJ SOLN
INTRAMUSCULAR | Status: AC
  Filled 2012-03-31: qty 5

## 2012-03-31 MED ORDER — HEMOSTATIC AGENTS (NO CHARGE) OPTIME
TOPICAL | Status: DC | PRN
  Administered 2012-03-31: 1 via TOPICAL

## 2012-03-31 MED ORDER — SUCCINYLCHOLINE CHLORIDE 20 MG/ML IJ SOLN
INTRAMUSCULAR | Status: DC | PRN
  Administered 2012-03-31: 110 mg via INTRAVENOUS

## 2012-03-31 MED ORDER — GLYCOPYRROLATE 0.2 MG/ML IJ SOLN
INTRAMUSCULAR | Status: AC
  Filled 2012-03-31: qty 1

## 2012-03-31 MED ORDER — ROCURONIUM BROMIDE 100 MG/10ML IV SOLN
INTRAVENOUS | Status: DC | PRN
  Administered 2012-03-31: 5 mg via INTRAVENOUS
  Administered 2012-03-31: 25 mg via INTRAVENOUS

## 2012-03-31 SURGICAL SUPPLY — 39 items
APPLIER CLIP LAPSCP 10X32 DD (CLIP) ×2 IMPLANT
BAG HAMPER (MISCELLANEOUS) ×2 IMPLANT
BAG SPEC RTRVL LRG 6X4 10 (ENDOMECHANICALS) ×1
CLOTH BEACON ORANGE TIMEOUT ST (SAFETY) ×2 IMPLANT
COVER LIGHT HANDLE STERIS (MISCELLANEOUS) ×4 IMPLANT
DECANTER SPIKE VIAL GLASS SM (MISCELLANEOUS) ×2 IMPLANT
DURAPREP 26ML APPLICATOR (WOUND CARE) ×2 IMPLANT
ELECT REM PT RETURN 9FT ADLT (ELECTROSURGICAL) ×2
ELECTRODE REM PT RTRN 9FT ADLT (ELECTROSURGICAL) ×1 IMPLANT
FILTER SMOKE EVAC LAPAROSHD (FILTER) ×2 IMPLANT
FORMALIN 10 PREFIL 120ML (MISCELLANEOUS) ×2 IMPLANT
GLOVE BIO SURGEON STRL SZ7.5 (GLOVE) ×2 IMPLANT
GLOVE BIOGEL PI IND STRL 7.0 (GLOVE) IMPLANT
GLOVE BIOGEL PI IND STRL 8 (GLOVE) IMPLANT
GLOVE BIOGEL PI INDICATOR 7.0 (GLOVE) ×2
GLOVE BIOGEL PI INDICATOR 8 (GLOVE) ×1
GLOVE ECLIPSE 6.5 STRL STRAW (GLOVE) ×1 IMPLANT
GLOVE SS BIOGEL STRL SZ 6.5 (GLOVE) IMPLANT
GLOVE SUPERSENSE BIOGEL SZ 6.5 (GLOVE) ×1
GOWN STRL REIN XL XLG (GOWN DISPOSABLE) ×6 IMPLANT
HEMOSTAT SNOW SURGICEL 2X4 (HEMOSTASIS) ×2 IMPLANT
INST SET LAPROSCOPIC AP (KITS) ×2 IMPLANT
KIT ROOM TURNOVER APOR (KITS) ×2 IMPLANT
KIT TROCAR LAP CHOLE (TROCAR) ×2 IMPLANT
MANIFOLD NEPTUNE II (INSTRUMENTS) ×2 IMPLANT
NS IRRIG 1000ML POUR BTL (IV SOLUTION) ×2 IMPLANT
PACK LAP CHOLE LZT030E (CUSTOM PROCEDURE TRAY) ×2 IMPLANT
PAD ARMBOARD 7.5X6 YLW CONV (MISCELLANEOUS) ×2 IMPLANT
PENCIL HANDSWITCHING (ELECTRODE) ×1 IMPLANT
POUCH SPECIMEN RETRIEVAL 10MM (ENDOMECHANICALS) ×2 IMPLANT
SET BASIN LINEN APH (SET/KITS/TRAYS/PACK) ×2 IMPLANT
SPONGE GAUZE 2X2 8PLY STRL LF (GAUZE/BANDAGES/DRESSINGS) ×8 IMPLANT
STAPLER VISISTAT (STAPLE) ×2 IMPLANT
SUT ETHIBOND 0 MO6 C/R (SUTURE) ×1 IMPLANT
SUT VIC AB 2-0 CT2 27 (SUTURE) ×1 IMPLANT
SUT VICRYL 0 UR6 27IN ABS (SUTURE) ×2 IMPLANT
TAPE CLOTH SURG 4X10 WHT LF (GAUZE/BANDAGES/DRESSINGS) ×1 IMPLANT
WARMER LAPAROSCOPE (MISCELLANEOUS) ×2 IMPLANT
YANKAUER SUCT 12FT TUBE ARGYLE (SUCTIONS) ×2 IMPLANT

## 2012-03-31 NOTE — Transfer of Care (Signed)
Immediate Anesthesia Transfer of Care Note  Patient: Jacob Douglas  Procedure(s) Performed: Procedure(s): LAPAROSCOPIC CHOLECYSTECTOMY (N/A) HERNIA REPAIR UMBILICAL ADULT (N/A)  Patient Location: PACU  Anesthesia Type:General  Level of Consciousness: awake, alert  and oriented  Airway & Oxygen Therapy: Patient Spontanous Breathing  Post-op Assessment: Report given to PACU RN  Post vital signs: Reviewed and stable  Complications: No apparent anesthesia complications

## 2012-03-31 NOTE — Anesthesia Preprocedure Evaluation (Signed)
Anesthesia Evaluation  Patient identified by MRN, date of birth, ID band Patient awake    Reviewed: Allergy & Precautions, H&P , NPO status , Patient's Chart, lab work & pertinent test results  Airway Mallampati: II TM Distance: >3 FB Neck ROM: Full    Dental  (+) Teeth Intact   Pulmonary neg pulmonary ROS,  breath sounds clear to auscultation        Cardiovascular hypertension, Pt. on medications Rhythm:Regular Rate:Normal     Neuro/Psych Anxiety    GI/Hepatic GERD-  Medicated and Controlled,  Endo/Other    Renal/GU      Musculoskeletal   Abdominal   Peds  Hematology   Anesthesia Other Findings   Reproductive/Obstetrics                           Anesthesia Physical Anesthesia Plan  ASA: II  Anesthesia Plan: General   Post-op Pain Management:    Induction: Intravenous, Rapid sequence and Cricoid pressure planned  Airway Management Planned: Oral ETT  Additional Equipment:   Intra-op Plan:   Post-operative Plan: Extubation in OR  Informed Consent: I have reviewed the patients History and Physical, chart, labs and discussed the procedure including the risks, benefits and alternatives for the proposed anesthesia with the patient or authorized representative who has indicated his/her understanding and acceptance.     Plan Discussed with:   Anesthesia Plan Comments:         Anesthesia Quick Evaluation

## 2012-03-31 NOTE — Op Note (Signed)
Patient:  JVION TURGEON  DOB:  [birth year]  MRN:  [id number]   Preop Diagnosis:  Biliary colic, cholelithiasis, umbilical hernia  Postop Diagnosis:  Same  Procedure:  Laparoscopic cholecystectomy, umbilical herniorrhaphy  Surgeon:  Franky Macho, M.D.  Anes:  General endotracheal  Indications:  Patient is a 52 year old white male who presents with biliary colic secondary to cholelithiasis. He also has a symptomatic umbilical hernia. The risks and benefits of both procedures including bleeding, infection, hepatobiliary, the possibly of an open procedure, and the possibility of recurrence of the hernia were fully explained to the patient, the informed consent.  Procedure note:  The patient is placed the supine position. After induction of general endotracheal anesthesia, the abdomen was prepped and draped using usual sterile technique with DuraPrep. Surgical site confirmation was performed.  A supraumbilical incision was made down to the fascia. A Veress needle was introduced into the abdominal cavity and confirmation of placement was done using the saline drop test. The abdomen was then insufflated to 16 mm mercury pressure. An 11 mm trocar was introduced into the abdominal cavity under direct visualization without difficulty. The patient was placed in reverse Trendelenburg position and an additional 11 mm trocar was placed the epigastric region. 5 mm trochars were placed in the right upper quadrant and right flank regions. The liver was inspected and noted to be within normal limits. The gallbladder was retracted in a dynamic fashion in order to facilitate exposure of the triangle of Calot. The cystic duct was first identified. Its juncture to the infundibulum was fully identified. Endoclips were placed proximally distally on the cystic duct, and the cystic duct was divided. This is likewise done to the cystic artery. The gallbladder was then freed away from the gallbladder fossa using Bovie  electrocautery. Some spillage of bile was noted during the dissection of the gallbladder. The gallbladder was delivered through the epigastric trocar site using an Endo Catch bag. The gallbladder fossa was inspected no abnormal bleeding or bile leakage was noted. Surgicel was placed in the gallbladder fossa. All fluid and air were then evacuated from the abdominal cavity prior to removal of the trochars.  All wounds were gave normal saline. All wounds were checked with 0.5% Sensorcaine. The umbilicus was freed away from the underlying fascia. A hernia sac was found. This was excised and the omental hernia contents reduced. The fascial defect was closed transversely using 0 Ethibond interrupted sutures. The supraumbilical trocar site incision was also closed using 0 Ethibond interrupted sutures. All skin incisions were closed using staples. Betadine ointment and dressed a dressings were applied.  All tape and needle counts were correct at the end of the procedure. The patient was extubated in the operating room and transferred to PACU in stable condition.  Complications:  None  EBL:  Minimal  Specimen:  Gallbladder

## 2012-03-31 NOTE — Anesthesia Postprocedure Evaluation (Signed)
  Anesthesia Post-op Note  Patient: Jacob Douglas  Procedure(s) Performed: Procedure(s): LAPAROSCOPIC CHOLECYSTECTOMY (N/A) HERNIA REPAIR UMBILICAL ADULT (N/A)  Patient Location: PACU  Anesthesia Type:General  Level of Consciousness: awake, alert  and oriented  Airway and Oxygen Therapy: Patient Spontanous Breathing and Patient connected to face mask oxygen  Post-op Pain: none  Post-op Assessment: Post-op Vital signs reviewed, Patient's Cardiovascular Status Stable, Respiratory Function Stable, Patent Airway and No signs of Nausea or vomiting  Post-op Vital Signs: Reviewed and stable  Complications: No apparent anesthesia complications

## 2012-03-31 NOTE — Interval H&P Note (Signed)
History and Physical Interval Note:  03/31/2012 10:25 AM  Elmore Guise  has presented today for surgery, with the diagnosis of cholelithiasis  The various methods of treatment have been discussed with the patient and family. After consideration of risks, benefits and other options for treatment, the patient has consented to  Procedure(s): LAPAROSCOPIC CHOLECYSTECTOMY (N/A) as a surgical intervention .  The patient's history has been reviewed, patient examined, no change in status, stable for surgery.  I have reviewed the patient's chart and labs.  Questions were answered to the patient's satisfaction.     Franky Macho A

## 2012-03-31 NOTE — Anesthesia Procedure Notes (Signed)
Procedure Name: Intubation Performed by: Moshe Salisbury Pre-anesthesia Checklist: Patient identified, Patient being monitored, Timeout performed, Emergency Drugs available and Suction available Patient Re-evaluated:Patient Re-evaluated prior to inductionOxygen Delivery Method: Circle System Utilized Preoxygenation: Pre-oxygenation with 100% oxygen Intubation Type: IV induction, Rapid sequence and Cricoid Pressure applied Ventilation: Mask ventilation without difficulty Laryngoscope Size: Mac and 3 Grade View: Grade I Tube type: Oral Tube size: 7.0 mm Number of attempts: 1 Airway Equipment and Method: stylet Placement Confirmation: ETT inserted through vocal cords under direct vision,  positive ETCO2 and breath sounds checked- equal and bilateral Secured at: 2 cm Tube secured with: Tape Dental Injury: Teeth and Oropharynx as per pre-operative assessment

## 2012-04-01 ENCOUNTER — Encounter (HOSPITAL_COMMUNITY): Payer: Self-pay | Admitting: General Surgery

## 2012-04-01 NOTE — Progress Notes (Signed)
Spoke with patient's wife as pt. was sleeping.  She stated pt. was having nausea and vomiting.  They spoke with Dr. Lovell Sheehan this am and obtained a rx for a nausea med. that they will be picking up.  Patient is able to keep down clear liquids.

## 2012-04-04 ENCOUNTER — Encounter (HOSPITAL_COMMUNITY): Payer: Self-pay

## 2012-04-04 ENCOUNTER — Encounter (HOSPITAL_COMMUNITY): Payer: Managed Care, Other (non HMO)

## 2012-04-04 ENCOUNTER — Other Ambulatory Visit (HOSPITAL_COMMUNITY): Payer: Self-pay | Admitting: General Surgery

## 2012-04-04 ENCOUNTER — Inpatient Hospital Stay (HOSPITAL_COMMUNITY)
Admission: AD | Admit: 2012-04-04 | Discharge: 2012-04-08 | DRG: 445 | Disposition: A | Discharge status: Home / Self Care | Payer: Managed Care, Other (non HMO) | Source: Ambulatory Visit | Attending: General Surgery | Admitting: General Surgery

## 2012-04-04 VITALS — BP 131/85 | HR 68 | Temp 98.3°F | Resp 18 | Ht 69.0 in | Wt 186.2 lb

## 2012-04-04 DIAGNOSIS — R109 Unspecified abdominal pain: Secondary | ICD-10-CM

## 2012-04-04 DIAGNOSIS — E876 Hypokalemia: Secondary | ICD-10-CM | POA: Diagnosis present

## 2012-04-04 DIAGNOSIS — I10 Essential (primary) hypertension: Secondary | ICD-10-CM | POA: Diagnosis present

## 2012-04-04 DIAGNOSIS — K219 Gastro-esophageal reflux disease without esophagitis: Secondary | ICD-10-CM | POA: Diagnosis present

## 2012-04-04 DIAGNOSIS — D131 Benign neoplasm of stomach: Secondary | ICD-10-CM | POA: Diagnosis present

## 2012-04-04 DIAGNOSIS — F411 Generalized anxiety disorder: Secondary | ICD-10-CM | POA: Diagnosis present

## 2012-04-04 DIAGNOSIS — Z79899 Other long term (current) drug therapy: Secondary | ICD-10-CM

## 2012-04-04 DIAGNOSIS — Z9089 Acquired absence of other organs: Secondary | ICD-10-CM

## 2012-04-04 DIAGNOSIS — J309 Allergic rhinitis, unspecified: Secondary | ICD-10-CM | POA: Diagnosis present

## 2012-04-04 DIAGNOSIS — K838 Other specified diseases of biliary tract: Principal | ICD-10-CM | POA: Diagnosis present

## 2012-04-04 DIAGNOSIS — R17 Unspecified jaundice: Secondary | ICD-10-CM | POA: Diagnosis present

## 2012-04-04 DIAGNOSIS — K759 Inflammatory liver disease, unspecified: Secondary | ICD-10-CM | POA: Diagnosis present

## 2012-04-04 LAB — CBC
HCT: 38.6 % — ABNORMAL LOW (ref 39.0–52.0)
MCV: 91 fL (ref 78.0–100.0)
RBC: 4.24 MIL/uL (ref 4.22–5.81)
RDW: 13.3 % (ref 11.5–15.5)
WBC: 14.3 10*3/uL — ABNORMAL HIGH (ref 4.0–10.5)

## 2012-04-04 LAB — HEPATIC FUNCTION PANEL
ALT: 160 U/L — ABNORMAL HIGH (ref 0–53)
AST: 54 U/L — ABNORMAL HIGH (ref 0–37)
Albumin: 3 g/dL — ABNORMAL LOW (ref 3.5–5.2)
Total Bilirubin: 5.8 mg/dL — ABNORMAL HIGH (ref 0.3–1.2)

## 2012-04-04 LAB — AMYLASE: Amylase: 36 U/L (ref 0–105)

## 2012-04-04 LAB — BASIC METABOLIC PANEL
BUN: 8 mg/dL (ref 6–23)
CO2: 25 mEq/L (ref 19–32)
Chloride: 96 mEq/L (ref 96–112)
GFR calc Af Amer: >90 mL/min (ref >90)
Potassium: 3.2 mEq/L — ABNORMAL LOW (ref 3.5–5.1)

## 2012-04-04 MED ORDER — CIPROFLOXACIN IN D5W 400 MG/200ML IV SOLN
400.0000 mg | Freq: Two times a day (BID) | INTRAVENOUS | Status: DC
  Administered 2012-04-04 – 2012-04-06 (×4): 400 mg via INTRAVENOUS
  Filled 2012-04-04 (×6): qty 200

## 2012-04-04 MED ORDER — BENAZEPRIL HCL 10 MG PO TABS
20.0000 mg | ORAL_TABLET | Freq: Every day | ORAL | Status: DC
  Administered 2012-04-05 – 2012-04-07 (×3): 20 mg via ORAL
  Filled 2012-04-04 (×3): qty 2

## 2012-04-04 MED ORDER — PANTOPRAZOLE SODIUM 40 MG IV SOLR
40.0000 mg | INTRAVENOUS | Status: DC
  Administered 2012-04-04 – 2012-04-05 (×2): 40 mg via INTRAVENOUS
  Filled 2012-04-04 (×2): qty 40

## 2012-04-04 MED ORDER — CIPROFLOXACIN IN D5W 400 MG/200ML IV SOLN
INTRAVENOUS | Status: AC
  Filled 2012-04-04: qty 400

## 2012-04-04 MED ORDER — TECHNETIUM TC 99M MEBROFENIN IV KIT
5.0000 | PACK | Freq: Once | INTRAVENOUS | Status: AC | PRN
  Administered 2012-04-04: 5 via INTRAVENOUS

## 2012-04-04 MED ORDER — AMLODIPINE BESYLATE 5 MG PO TABS
5.0000 mg | ORAL_TABLET | Freq: Every day | ORAL | Status: DC
  Administered 2012-04-05 – 2012-04-07 (×3): 5 mg via ORAL
  Filled 2012-04-04 (×4): qty 1

## 2012-04-04 MED ORDER — HYDROMORPHONE HCL PF 1 MG/ML IJ SOLN
1.0000 mg | INTRAMUSCULAR | Status: DC | PRN
  Administered 2012-04-05 – 2012-04-08 (×14): 1 mg via INTRAVENOUS
  Filled 2012-04-04 (×15): qty 1

## 2012-04-04 MED ORDER — POTASSIUM CHLORIDE 10 MEQ/100ML IV SOLN
10.0000 meq | INTRAVENOUS | Status: AC
  Administered 2012-04-04 – 2012-04-05 (×3): 10 meq via INTRAVENOUS
  Filled 2012-04-04: qty 100
  Filled 2012-04-04: qty 200

## 2012-04-04 MED ORDER — LORAZEPAM 2 MG/ML IJ SOLN
1.0000 mg | INTRAMUSCULAR | Status: DC | PRN
  Administered 2012-04-04 – 2012-04-07 (×3): 1 mg via INTRAVENOUS
  Filled 2012-04-04 (×3): qty 1

## 2012-04-04 MED ORDER — LACTATED RINGERS IV SOLN
INTRAVENOUS | Status: DC
  Administered 2012-04-05 – 2012-04-06 (×4): via INTRAVENOUS

## 2012-04-04 MED ORDER — ONDANSETRON HCL 4 MG/2ML IJ SOLN: 4.0000 mg | Freq: Four times a day (QID) | INTRAMUSCULAR | Status: DC | PRN

## 2012-04-04 MED ORDER — SIMETHICONE 80 MG PO CHEW: 80.0000 mg | CHEWABLE_TABLET | Freq: Four times a day (QID) | ORAL | Status: DC | PRN

## 2012-04-04 MED ORDER — HYDROMORPHONE HCL 2 MG PO TABS
2.0000 mg | ORAL_TABLET | ORAL | Status: DC | PRN
  Administered 2012-04-04 – 2012-04-07 (×7): 2 mg via ORAL
  Filled 2012-04-04 (×8): qty 1

## 2012-04-04 MED ORDER — ALUM & MAG HYDROXIDE-SIMETH 200-200-20 MG/5ML PO SUSP
30.0000 mL | Freq: Four times a day (QID) | ORAL | Status: DC | PRN
  Administered 2012-04-04 – 2012-04-06 (×2): 30 mL via ORAL
  Filled 2012-04-04 (×2): qty 30

## 2012-04-04 MED ORDER — KETOROLAC TROMETHAMINE 30 MG/ML IJ SOLN
30.0000 mg | Freq: Three times a day (TID) | INTRAMUSCULAR | Status: DC | PRN
  Administered 2012-04-04: 30 mg via INTRAVENOUS
  Filled 2012-04-04: qty 1

## 2012-04-04 NOTE — H&P (Signed)
Jacob Douglas is an 52 y.o. male.   Chief Complaint: Abdominal pain, fever of unknown etiology HPI: Patient is a 52 year old white male status post laparoscopic cholecystectomy with umbilical herniorrhaphy on 03/31/2012 who presents with a 48 hour history of worsening upper abdominal pain, nausea, and vomiting. He states he had a bowel movement yesterday evening. I did see him in my office yesterday and he was afebrile. Apparently last night, he spiked a fever of a 101. [name] undergoing a HIDA scan.  Past Medical History  Diagnosis Date  . Seasonal allergies   . Hypertension   . GERD (gastroesophageal reflux disease)   . Anxiety     Past Surgical History  Procedure Laterality Date  . Back surgery  2002  . Colonoscopy  05/17/2011    Procedure: COLONOSCOPY;  Surgeon: Malissa Hippo, MD;  Location: [facility];  Service: Endoscopy;  Laterality: N/A;  830  . Cholecystectomy N/A 03/31/2012    Procedure: LAPAROSCOPIC CHOLECYSTECTOMY;  Surgeon: Dalia Heading, MD;  Location: [facility];  Service: General;  Laterality: N/A;  . Umbilical hernia repair N/A 03/31/2012    Procedure: HERNIA REPAIR UMBILICAL ADULT;  Surgeon: Dalia Heading, MD;  Location: [facility];  Service: General;  Laterality: N/A;    Family History  Problem Relation Age of Onset  . Colon cancer Neg Hx    Social History:  reports that he has never smoked. He does not have any smokeless tobacco history on file. He reports that he drinks about 2.0 ounces of alcohol per week. He reports that he does not use illicit drugs.  Allergies:  Allergies  Allergen Reactions  . Penicillins Rash    No prescriptions prior to admission    No results found for this or any previous visit (from the past 48 hour(s)). No results found.  Review of Systems  Constitutional: Positive for fever, malaise/fatigue and diaphoresis.  HENT: Negative.   Eyes: Negative.   Respiratory: Negative.   Cardiovascular: Negative.   Gastrointestinal:  Positive for heartburn, nausea, vomiting and abdominal pain.  Genitourinary: Negative.   Musculoskeletal: Negative.   Skin: Negative.   Neurological: Positive for weakness.  Endo/Heme/Allergies: Negative.   Psychiatric/Behavioral: Negative.     Blood pressure 142/94, pulse 65, temperature 97.5 F (36.4 C), temperature source Oral, resp. rate 15, SpO2 96.00%. Physical Exam  Constitutional: He is oriented to person, place, and time. He appears well-developed and well-nourished.  HENT:  Head: Normocephalic and atraumatic.  Eyes: No scleral icterus.  Neck: Normal range of motion. Neck supple.  Cardiovascular: Normal rate, regular rhythm and normal heart sounds.   Respiratory: Effort normal and breath sounds normal.  GI: Soft. Bowel sounds are normal. He exhibits no distension. There is tenderness. There is no rebound.  Neurological: He is alert and oriented to person, place, and time.  Skin: Skin is warm and dry.     Assessment/Plan Impression: Fever of unknown etiology, status post laparoscopic cholecystectomy on 03/31/2012. Plan: We'll continue to rule out hepatobiliary injury or retained gallstone. Plan: We'll get the patient to the hospital for control of his nausea and vomiting. Further management is pending results of his hydroscan. We'll continue ciprofloxacin.   Kayde Atkerson A 04/04/2012, 2:09 PM

## 2012-04-05 ENCOUNTER — Encounter (HOSPITAL_COMMUNITY)
Admission: AD | Disposition: A | Discharge status: Home / Self Care | Payer: Self-pay | Source: Ambulatory Visit | Attending: General Surgery

## 2012-04-05 ENCOUNTER — Observation Stay (HOSPITAL_COMMUNITY): Payer: Managed Care, Other (non HMO)

## 2012-04-05 ENCOUNTER — Encounter (HOSPITAL_COMMUNITY): Payer: Self-pay | Admitting: Anesthesiology

## 2012-04-05 ENCOUNTER — Encounter (HOSPITAL_COMMUNITY): Payer: Self-pay

## 2012-04-05 DIAGNOSIS — K5710 Diverticulosis of small intestine without perforation or abscess without bleeding: Secondary | ICD-10-CM

## 2012-04-05 DIAGNOSIS — R1011 Right upper quadrant pain: Secondary | ICD-10-CM

## 2012-04-05 DIAGNOSIS — K838 Other specified diseases of biliary tract: Secondary | ICD-10-CM

## 2012-04-05 DIAGNOSIS — R17 Unspecified jaundice: Secondary | ICD-10-CM

## 2012-04-05 HISTORY — PX: SPHINCTEROTOMY: SHX5544

## 2012-04-05 HISTORY — PX: ERCP: SHX5425

## 2012-04-05 HISTORY — PX: BALLOON DILATION: SHX5330

## 2012-04-05 LAB — BASIC METABOLIC PANEL
BUN: 13 mg/dL (ref 6–23)
CO2: 25 mEq/L (ref 19–32)
Glucose, Bld: 122 mg/dL — ABNORMAL HIGH (ref 70–99)
Potassium: 3.4 mEq/L — ABNORMAL LOW (ref 3.5–5.1)
Sodium: 135 mEq/L (ref 135–145)

## 2012-04-05 LAB — CBC
Hemoglobin: 12 g/dL — ABNORMAL LOW (ref 13.0–17.0)
MCH: 30.5 pg (ref 26.0–34.0)
MCV: 90.6 fL (ref 78.0–100.0)
RBC: 3.93 MIL/uL — ABNORMAL LOW (ref 4.22–5.81)

## 2012-04-05 LAB — URINALYSIS, ROUTINE W REFLEX MICROSCOPIC
Ketones, ur: 40 mg/dL — AB
Nitrite: NEGATIVE
Protein, ur: 100 mg/dL — AB
Urobilinogen, UA: 2 mg/dL — ABNORMAL HIGH (ref 0.0–1.0)

## 2012-04-05 LAB — HEPATIC FUNCTION PANEL
ALT: 142 U/L — ABNORMAL HIGH (ref 0–53)
Alkaline Phosphatase: 283 U/L — ABNORMAL HIGH (ref 39–117)
Bilirubin, Direct: 4.2 mg/dL — ABNORMAL HIGH (ref 0.0–0.3)
Indirect Bilirubin: 1.6 mg/dL — ABNORMAL HIGH (ref 0.3–0.9)
Total Bilirubin: 5.8 mg/dL — ABNORMAL HIGH (ref 0.3–1.2)

## 2012-04-05 LAB — URINE MICROSCOPIC-ADD ON

## 2012-04-05 SURGERY — ERCP, WITH INTERVENTION IF INDICATED
Anesthesia: General

## 2012-04-05 MED ORDER — NEOSTIGMINE METHYLSULFATE 1 MG/ML IJ SOLN
INTRAMUSCULAR | Status: DC | PRN
  Administered 2012-04-05: 2 mg via INTRAVENOUS

## 2012-04-05 MED ORDER — SUCCINYLCHOLINE CHLORIDE 20 MG/ML IJ SOLN
INTRAMUSCULAR | Status: AC
  Filled 2012-04-05: qty 1

## 2012-04-05 MED ORDER — SODIUM CHLORIDE 0.9 % IV SOLN: INTRAVENOUS | Status: DC

## 2012-04-05 MED ORDER — ROCURONIUM BROMIDE 50 MG/5ML IV SOLN
INTRAVENOUS | Status: AC
  Filled 2012-04-05: qty 1

## 2012-04-05 MED ORDER — FENTANYL CITRATE 0.05 MG/ML IJ SOLN
INTRAMUSCULAR | Status: AC
  Filled 2012-04-05: qty 5

## 2012-04-05 MED ORDER — INDOMETHACIN 50 MG RE SUPP
RECTAL | Status: AC
  Filled 2012-04-05: qty 1

## 2012-04-05 MED ORDER — LIDOCAINE HCL (CARDIAC) 20 MG/ML IV SOLN
INTRAVENOUS | Status: DC | PRN
  Administered 2012-04-05: 40 mg via INTRAVENOUS

## 2012-04-05 MED ORDER — MIDAZOLAM HCL 2 MG/2ML IJ SOLN
INTRAMUSCULAR | Status: AC
  Filled 2012-04-05: qty 2

## 2012-04-05 MED ORDER — GLYCOPYRROLATE 0.2 MG/ML IJ SOLN
INTRAMUSCULAR | Status: DC | PRN
  Administered 2012-04-05: .3 mg via INTRAVENOUS

## 2012-04-05 MED ORDER — ONDANSETRON HCL 4 MG/2ML IJ SOLN
INTRAMUSCULAR | Status: AC
  Filled 2012-04-05: qty 2

## 2012-04-05 MED ORDER — GLYCOPYRROLATE 0.2 MG/ML IJ SOLN
INTRAMUSCULAR | Status: AC
  Filled 2012-04-05: qty 2

## 2012-04-05 MED ORDER — STERILE WATER FOR IRRIGATION IR SOLN
Status: DC | PRN
  Administered 2012-04-05: 12:00:00

## 2012-04-05 MED ORDER — INDOMETHACIN 50 MG RE SUPP
50.0000 mg | RECTAL | Status: AC
Start: 2012-04-05 — End: 2012-04-05
  Administered 2012-04-05: 50 mg via RECTAL

## 2012-04-05 MED ORDER — MIDAZOLAM HCL 5 MG/5ML IJ SOLN
INTRAMUSCULAR | Status: DC | PRN
  Administered 2012-04-05: 2 mg via INTRAVENOUS

## 2012-04-05 MED ORDER — ROCURONIUM BROMIDE 100 MG/10ML IV SOLN
INTRAVENOUS | Status: DC | PRN
  Administered 2012-04-05: 5 mg via INTRAVENOUS
  Administered 2012-04-05: 20 mg via INTRAVENOUS

## 2012-04-05 MED ORDER — LIDOCAINE HCL (PF) 1 % IJ SOLN
INTRAMUSCULAR | Status: AC
  Filled 2012-04-05: qty 5

## 2012-04-05 MED ORDER — SUCCINYLCHOLINE CHLORIDE 20 MG/ML IJ SOLN
INTRAMUSCULAR | Status: DC | PRN
  Administered 2012-04-05: 120 mg via INTRAVENOUS

## 2012-04-05 MED ORDER — FENTANYL CITRATE 0.05 MG/ML IJ SOLN
INTRAMUSCULAR | Status: DC | PRN
  Administered 2012-04-05: 50 ug via INTRAVENOUS
  Administered 2012-04-05: 25 ug via INTRAVENOUS
  Administered 2012-04-05: 50 ug via INTRAVENOUS
  Administered 2012-04-05: 25 ug via INTRAVENOUS
  Administered 2012-04-05: 50 ug via INTRAVENOUS

## 2012-04-05 MED ORDER — ONDANSETRON HCL 4 MG/2ML IJ SOLN
INTRAMUSCULAR | Status: DC | PRN
  Administered 2012-04-05: 4 mg via INTRAVENOUS

## 2012-04-05 MED ORDER — PROPOFOL 10 MG/ML IV EMUL
INTRAVENOUS | Status: AC
  Filled 2012-04-05: qty 20

## 2012-04-05 MED ORDER — ENOXAPARIN SODIUM 40 MG/0.4ML ~~LOC~~ SOLN
40.0000 mg | SUBCUTANEOUS | Status: DC
  Administered 2012-04-05 – 2012-04-06 (×2): 40 mg via SUBCUTANEOUS
  Filled 2012-04-05 (×2): qty 0.4

## 2012-04-05 MED ORDER — PROPOFOL 10 MG/ML IV BOLUS
INTRAVENOUS | Status: DC | PRN
  Administered 2012-04-05: 150 mg via INTRAVENOUS

## 2012-04-05 MED ORDER — SODIUM CHLORIDE 0.9 % IV SOLN
INTRAVENOUS | Status: DC | PRN
  Administered 2012-04-05: 12:00:00

## 2012-04-05 SURGICAL SUPPLY — 29 items
BALLN DILATOR CRE WIREGUIDE (BALLOONS) ×2
BALLN RETRIEVAL 12X15 (BALLOONS) ×1 IMPLANT
BALLOON DILATOR CRE WIREGUIDE (BALLOONS) IMPLANT
BALN RTRVL 200 6-7FR 12-15 (BALLOONS) ×1
BASKET TRAPEZOID 3X6 (MISCELLANEOUS) IMPLANT
BASKET TRAPEZOID LITHO 2.5X5 (MISCELLANEOUS) IMPLANT
BLOCK BITE 60FR ADLT L/F BLUE (MISCELLANEOUS) ×1 IMPLANT
BSKT STON RTRVL TRAPEZOID 3X6 (MISCELLANEOUS)
DEVICE INFLATION ENCORE 26 (MISCELLANEOUS) IMPLANT
DEVICE LOCKING W-BIOPSY CAP (MISCELLANEOUS) ×2 IMPLANT
FLOOR PAD 36X40 (MISCELLANEOUS) ×2
GUIDEWIRE HYDRA JAGWIRE .35 (WIRE) IMPLANT
GUIDEWIRE JAG HINI 025X260CM (WIRE) IMPLANT
NDL HYPO 18GX1.5 BLUNT FILL (NEEDLE) IMPLANT
NEEDLE HYPO 18GX1.5 BLUNT FILL (NEEDLE) IMPLANT
PAD FLOOR 36X40 (MISCELLANEOUS) IMPLANT
SNARE ROTATE MED OVAL 20MM (MISCELLANEOUS) IMPLANT
SNARE SHORT THROW 27M MED OVAL (MISCELLANEOUS) IMPLANT
SPHINCTEROTOME AUTOTOME .25 (MISCELLANEOUS) IMPLANT
SPHINCTEROTOME HYDRATOME 44 (MISCELLANEOUS) ×3 IMPLANT
SPONGE GAUZE 4X4 12PLY (GAUZE/BANDAGES/DRESSINGS) ×1 IMPLANT
SYR 20CC LL (SYRINGE) IMPLANT
SYR 3ML LL SCALE MARK (SYRINGE) IMPLANT
SYR 50ML LL SCALE MARK (SYRINGE) ×2 IMPLANT
SYSTEM CONTINUOUS INJECTION (MISCELLANEOUS) ×2 IMPLANT
TUBING ENDO SMARTCAP PENTAX (MISCELLANEOUS) ×1 IMPLANT
WALLSTENT METAL COVERED 10X60 (STENTS) IMPLANT
WALLSTENT METAL COVERED 10X80 (STENTS) IMPLANT
WATER STERILE IRR 1000ML POUR (IV SOLUTION) ×2 IMPLANT

## 2012-04-05 NOTE — Progress Notes (Signed)
Patient seen post ERCP at 15:15. He looks great; wants to eat. He's already been given ice cream by nursing staff (which he tolerated very well). He appears less jaundiced subjectively. He has much less epigastric and right upper quadrant tenderness to palpation that he did pre-ERCP. He is progressing nicely. Will advance him to a low-fat diet for supper. Labs to be checked in the morning.

## 2012-04-05 NOTE — Op Note (Signed)
NAME:  Jacob Douglas, Jacob Douglas                 ACCOUNT NO.:  [id number]  MEDICAL RECORD NO.:  [id number]  LOCATION:  A331                          FACILITY:  [facility]  PHYSICIAN:  R. Roetta Sessions, MD FACP FACGDATE OF BIRTH:  July 13, 1960  DATE OF PROCEDURE: DATE OF DISCHARGE:                              OPERATIVE REPORT   PROCEDURE:  ERCP with sphincterotomy and balloon dredging of the bile duct.  INDICATIONS FOR PROCEDURE:  A 52 year old gentleman with right upper quadrant abdominal pain and jaundice following laparoscopic cholecystectomy 5 days ago.  ERCP now being done to further evaluate the etiology of what appears to be partial extrahepatic biliary obstruction. Risks, benefits, limitations, alternatives, and imponderables have been discussed with the patient and the patient's family members at length. Please see my consultation note for more information.  PROCEDURE NOTE:  Endotracheal anesthesia was induced by Delphia Grates, CRNA and associates.  The patient was placed in the semiprone position.  INSTRUMENT:  Pentax video chip system.  FINDINGS:  Cursory examination of the distal esophagus and stomach revealed multiple multilobulated 1-2 cm gastric polyps.  There were fairly numerous.  Please see photos.  The antrum and pylorus were readily identified.  The side-viewing scope was easily advanced through the pyloric channel into the 2nd portion of the duodenum.  The scope was pulled back to the short position 55 cm from the incisors.  Scout film was taken.  The ampulla of vater was resting on the rim of a moderate size duodenum diverticulum.  The ampullary orifice was somewhat gaping open and there appeared to be some sludge and probable soft stone material adjacent to the ampulla in the duodenal diverticulum.  Please see photos.  I approached the ampulla with the Microvasive sphincterotome utilizing guidewire palpation, I accessed initially the cystic duct. Injection was made and this past  provided good bit of the cystic duct. The guide wire initially went into the cystic duct.  I pulled back and repositioned and seen thereafter achieved deep biliary cannulation. Ejection was made and there was minimal opacification of the midsegment of pancreatic duct which appeared grossly normal.  The cholangiogram revealed normal appearing biliary tree - status post cholecystectomy.  The bile duct was not dilated, although there was a question of a distal common bile duct filling defect.  There was no stricture.  The surgical clips appeared to be in appropriate position.  I was unable to identify a leak.  With the safety wire deep in the biliary tree, the sphincterotome was pulled across the ampullary orifice an approximately 8-9 mm biliary sphincterotomy was performed at 12 o'clock position without difficulty or apparent complication.  I subsequently railed off the sphincterotome and  railed on a graduated extractor balloon[ it was advanced to the confluence and inflated to accommodate the size with nondilated bile duct.  A balloon occlusion cholangiogram was performed 3 times.  As the sweep was made on real-time fluoro images, there was good opacification of the biliary tree and a nice smooth taper distally.  No stone material was recovered.  Unfortunately, the runs were not saved by the technologist for later interpretation.The biliary tree appeared to drain very well after this  maneuver.  It is notable there was a very low takeoff to the cystic duct, as outlined above.  The patient tolerated this procedure well and was taken to PACU in stable condition.  IMPRESSION: 1. Juxta ampullary duodenal diverticulum, ampullary orifice somewhat     gaping with probable stone/sludge material adjacent to the ampulla     and the duodenal diverticulum suggestive of recent stone passage through     the ampullary orifice. 2. Normal-appearing residual biliary tree with low cystic duct      takeoff. 3. No evidence of biliary leak.  No evidence of residual common duct     stone disease. 4. No stricture with surgical clips in appropriate position. 5. Status post endoscopic biliary sphincterotomy and balloon dredging     of the bile duct as described above. 6. Multiple gastric polyps.  RECOMMENDATIONS: 1. Clear liquids remainder today, advance diet as tolerated within     next 24 hours. 2. Recheck hepatic profile tomorrow morning. 3. We will plan for referral back to Dr. Karilyn Cota as an outpatient for     consideration of further evaluation of gastric polyps as he deems     Appropriate.  I have reviewed the films afterwards with Dr. Grace Isaac. Findings are consistent with my real-time interpretation.     Jonathon Bellows, MD [facility]     RMR/MEDQ  D:  04/05/2012  T:  04/05/2012  Job:  [id number]  cc:   Dr. Karilyn Cota, Dr. Filbert Schilder, M.D. Fax: [phone]

## 2012-04-05 NOTE — Progress Notes (Signed)
Subjective: No complaints of abdominal pain. Left upper quadrant pain has resolved. Patient states he may have passed blood in his urine.  Objective: Vital signs in last 24 hours: Temp:  [98.2 F (36.8 C)-98.9 F (37.2 C)] 98.4 F (36.9 C) (03/01 0530) Pulse Rate:  [84-99] 94 (03/01 0530) Resp:  [16-20] 20 (03/01 0530) BP: (114-132)/(78-88) 132/80 mmHg (03/01 0530) SpO2:  [92 %-96 %] 96 % (03/01 0530) Weight:  [84.46 kg (186 lb 3.2 oz)] 84.46 kg (186 lb 3.2 oz) (02/28 1705) Last BM Date: 04/03/12  Intake/Output from previous day:   Intake/Output this shift:    General appearance: alert and cooperative Resp: clear to auscultation bilaterally Cardio: regular rate and rhythm, S1, S2 normal, no murmur, click, rub or gallop GI: Soft. Incisions healing well. Mildly distended but not tense. No rigidity noted.  Lab Results:   Recent Labs  04/04/12 1650 04/05/12 0609  WBC 14.3* 11.7*  HGB 13.2 12.0*  HCT 38.6* 35.6*  PLT 310 317   BMET  Recent Labs  04/04/12 1650 04/05/12 0609  NA 136 135  K 3.2* 3.4*  CL 96 98  CO2 25 25  GLUCOSE 103* 122*  BUN 8 13  CREATININE 0.85 0.79  CALCIUM 9.3 8.9   PT/INR No results found for this basename: LABPROT, INR,  in the last 72 hours  Studies/Results: Nm Hepatobiliary  04/04/2012  *RADIOLOGY REPORT*  Clinical Data:  Abdominal pain.  Cholecystectomy on 03/31/2012  NUCLEAR MEDICINE HEPATOBILIARY IMAGING  Technique:  Sequential images of the abdomen were obtained out to 60 minutes following intravenous administration of radiopharmaceutical.  Radiopharmaceutical:  5 mCi Tc-76m mebrofenin  Comparison:  None.  Findings: The agent is retained extensively in the liver through 120 minutes.  However, there is activity seen in the small bowel at 60 minutes.  This indicates that the common bile duct is patent.  I suspect the patient has  hepatocellular dysfunction.  There is no evidence of bile leak.  IMPRESSION:  1.  No evidence of bile  leak. 2.  Abnormal retention of the agent within the liver suggesting hepatocellular dysfunction.   Original Report Authenticated By: Francene Boyers, M.D.    US Abdomen Limited Ruq  04/04/2012  *RADIOLOGY REPORT*  Clinical Data:  Status post cholecystectomy 4 days ago.  Ductal dilation.  Fever and abdominal pain.  LIMITED ABDOMINAL ULTRASOUND - RIGHT UPPER QUADRANT  Comparison:  Preoperative ultrasound 03/25/2012.  Findings:  Gallbladder:  Surgically absent.  Common bile duct:  Normal in caliber. No biliary ductal dilation. The maximal diameters 5.2 ml.  Liver:  Mild diffuse fatty infiltration is again noted.  A heterogeneous collection is present in the gallbladder fossa, measuring 6.7 x 2.6 x 2.4 cm.  IMPRESSION:  1.  Status post cholecystectomy. 2.  Heterogeneous collection in the gallbladder fossa following surgery.  This could represent hemorrhage.  A biliary leak or infection is not excluded.  No significant parenchymal changes are evident adjacent to this area.                    Original Report Authenticated By: Marin Roberts, M.D.     Anti-infectives: Anti-infectives   Start     Dose/Rate Route Frequency Ordered Stop   04/04/12 1800  ciprofloxacin (CIPRO) IVPB 400 mg     400 mg 200 mL/hr over 60 Minutes Intravenous Every 12 hours 04/04/12 1643        Assessment/Plan: Impression: Elevated liver enzyme tests with elevated bilirubin, direct bilirubin. Status post  laparoscopic cholecystectomy on 03/31/2012. Hypokalemia improved. Leukocytosis resolving. Plan: Dr. Jena Gauss of GI has been consulted for an ERCP. Patient is fully aware that he may have a retained gallstone or other possible hepatobiliary injury. Further management is pending results of ERCP.  LOS: 1 day    Jacob Douglas A 04/05/2012

## 2012-04-05 NOTE — Consult Note (Signed)
Referring Provider: Dalia Heading, MD Primary Care Physician:  Carylon Perches, MD Primary Gastroenterologist:  Dr.  Karilyn Cota  Reason for Consultation:  Jaundice/right upper quadrant abdominal pain/fever S/P cholecystectomy 4 days ago  HPI: Patient is very pleasant 52 year old gentleman with a history of cholelithiasis who underwent a laparoscopic cholecystectomy on February 24 due to recurrent bouts of biliary colic. He had cholelithiasis and fatty-appearing liver and a nondilated biliary tree on prior ultrasound. His preop hepatic profile demonstrated an ALT of 76; everything else was normal. Since cholecystectomy the patient has been experiencing right upper quadrant and medial left upper quadrant abdominal pain and fever up to 101.7 at home along with nausea and anorexia.  White blood cell count up to 14,000 but has come down to 11,000 this morning. LFTs have risen postoperatively; this morning his AST is 90,  ALT is 142, alkaline phosphatase 83. Total bilirubin 5.8 with a direct of 4.2 .  Ultrasound yesterday demonstrated a nondilated biliary tree, a fatty-appearing liver and possible small fluid collection in the gallbladder fossa (Gelfoam was packed in the gallbladder fossa the time of surgery). HIDA scan yesterday demonstrated impressive retention of tracer within the liver parenchyma with patency of the biliary tree as demonstrated by tracer in the bile duct and duodenum. However, flow appeared to be somewhat meager by my interpretation. There were no findings to indicate a biliary leak. The patient was admitted yesterday afternoon and  placed on IV Cipro. His been receiving IV analgesic and anti-emetic therapy to control his symptoms of pain and nausea.    Past Medical History  Diagnosis Date  . Seasonal allergies   . Hypertension   . GERD (gastroesophageal reflux disease)   . Anxiety     Past Surgical History  Procedure Laterality Date  . Back surgery  2002  . Colonoscopy  05/17/2011   Procedure: COLONOSCOPY;  Surgeon: Malissa Hippo, MD;  Location: [facility];  Service: Endoscopy;  Laterality: N/A;  830  . Cholecystectomy N/A 03/31/2012    Procedure: LAPAROSCOPIC CHOLECYSTECTOMY;  Surgeon: Dalia Heading, MD;  Location: [facility];  Service: General;  Laterality: N/A;  . Umbilical hernia repair N/A 03/31/2012    Procedure: HERNIA REPAIR UMBILICAL ADULT;  Surgeon: Dalia Heading, MD;  Location: [facility];  Service: General;  Laterality: N/A;    Prior to Admission medications   Medication Sig Start Date End Date Taking? Authorizing Provider  ALPRAZolam Prudy Feeler) 0.5 MG tablet Take 0.5 mg by mouth 2 (two) times daily as needed. Patient takes 1/2 tablet if needed, which is very rarely    Historical Provider, MD  amLODipine-benazepril (LOTREL) 5-20 MG per capsule Take 1 capsule by mouth daily.    Historical Provider, MD  diphenhydramine-acetaminophen (TYLENOL PM) 25-500 MG TABS Take 1 tablet by mouth at bedtime as needed.    Historical Provider, MD  Ibuprofen-Diphenhydramine Cit (ADVIL PM PO) Take by mouth.    Historical Provider, MD  Multiple Vitamin (MULITIVITAMIN WITH MINERALS) TABS Take 1 tablet by mouth daily.    Historical Provider, MD  oxyCODONE-acetaminophen (PERCOCET) 7.5-325 MG per tablet Take 1-2 tablets by mouth every 4 (four) hours as needed for pain. 03/31/12 03/31/13  Dalia Heading, MD  pantoprazole (PROTONIX) 40 MG tablet Take 40 mg by mouth daily.    Historical Provider, MD    Current Facility-Administered Medications  Medication Dose Route Frequency Provider Last Rate Last Dose  . alum & mag hydroxide-simeth (MAALOX/MYLANTA) 200-200-20 MG/5ML suspension 30 mL  30 mL Oral Q6H  PRN Dalia Heading, MD   30 mL at 04/04/12 1807  . amLODipine (NORVASC) tablet 5 mg  5 mg Oral Daily Dalia Heading, MD   5 mg at 04/05/12 0900  . benazepril (LOTENSIN) tablet 20 mg  20 mg Oral Daily Dalia Heading, MD   20 mg at 04/05/12 0900  . ciprofloxacin (CIPRO) IVPB 400 mg  400 mg  Intravenous Q12H Dalia Heading, MD   400 mg at 04/05/12 0527  . HYDROmorphone (DILAUDID) injection 1 mg  1 mg Intravenous Q3H PRN Dalia Heading, MD   1 mg at 04/05/12 0859  . HYDROmorphone (DILAUDID) tablet 2 mg  2 mg Oral Q3H PRN Dalia Heading, MD   2 mg at 04/05/12 0316  . ketorolac (TORADOL) 30 MG/ML injection 30 mg  30 mg Intravenous Q8H PRN Dalia Heading, MD   30 mg at 04/04/12 1854  . lactated ringers infusion   Intravenous Continuous Dalia Heading, MD      . LORazepam (ATIVAN) injection 1 mg  1 mg Intravenous Q4H PRN Dalia Heading, MD   1 mg at 04/04/12 2143  . ondansetron (ZOFRAN) injection 4 mg  4 mg Intravenous Q6H PRN Dalia Heading, MD      . pantoprazole (PROTONIX) injection 40 mg  40 mg Intravenous Q24H Dalia Heading, MD   40 mg at 04/04/12 1855  . simethicone (MYLICON) chewable tablet 80 mg  80 mg Oral Q6H PRN Dalia Heading, MD        Allergies as of 04/04/2012 - Review Complete 04/04/2012  Allergen Reaction Noted  . Penicillins Rash 05/17/2011    Family History  Problem Relation Age of Onset  . Colon cancer Neg Hx   . Hypertension Father   . Heart attack Other      Additional Social history:  Patient is married and has 2 healthy children. He lives in [place]. 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 He does not use tobacco products. He drinks a couple of occasional beverages occasionally over the weekend.   History   Social History  . Marital Status: Married    Spouse Name: N/A    Number of Children: N/A  . Years of Education: N/A   Occupational History  . Not on file.   Social History Main Topics  . Smoking status: Never Smoker   . Smokeless tobacco: Not on file  . Alcohol Use: 2.0 oz/week    4 drink(s) per week     Comment: occasional, social/weekends  . Drug Use: No  . Sexually Active: Not on file   Other Topics Concern  . Not on file   Social History Narrative  . No narrative on file    Review of Systems: Gen: no fatigue,  weakness, malaise, weight loss, and sleep disorder CV: Denies chest pain, angina, palpitations, syncope, orthopnea, PND, peripheral edema, and claudication. Resp: Denies dyspnea at rest, dyspnea with exercise, cough, sputum, wheezing, coughing up blood, and pleurisy. GI: Denies vomiting blood,and fecal incontinence.   Denies dysphagia or odynophagia. Derm: Denies rash, itching, dry skin, hives, moles, warts, or unhealing ulcers.  Psych: Denies depression, anxiety, memory loss, suicidal ideation, hallucinations, paranoia, and confusion. Heme: Denies bruising, bleeding, and enlarged lymph nodes.   Physical Exam: Vital signs in last 24 hours: Temp:  [98.2 F (36.8 C)-98.9 F (37.2 C)] 98.4 F (36.9 C) (03/01 0530) Pulse Rate:  [84-99] 94 (03/01 0530) Resp:  [16-20] 20 (03/01 0530) BP: (  114-132)/(78-88) 132/80 mmHg (03/01 0530) SpO2:  [92 %-96 %] 96 % (03/01 0530) Weight:  [186 lb 3.2 oz (84.46 kg)] 186 lb 3.2 oz (84.46 kg) (02/28 1705) Last BM Date: 04/03/12    General:   Alert,  jaundiced-appearing gentleman who is alert and conversant appears not to feel well. He is not toxic appearing. He is accompanied by his wife.  Head:  Normocephalic and atraumatic. Eyes:  Sclera icterus present.   Conjunctiva pink. Ears:  Normal auditory acuity. Nose:  No deformity, discharge,  or lesions. Mouth:  No deformity or lesions, dentition normal. Neck:  Supple; no masses or thyromegaly. Lungs:  Clear throughout to auscultation.   No wheezes, crackles, or rhonchi. No acute distress. Heart:  Regular rate and rhythm; no murmurs, clicks, rubs,  or gallops. Abdomen:  Nondistended. Laparoscopy sites stapled; no drainage present. Bowel sounds somewhat infrequent. Patient has diffuse tenderness in the right upper quadrant. He has some guarding. No rebound tenderness. No obvious mass organomegaly   Msk:  Symmetrical without gross deformities. Normal posture. Pulses:  Normal pulses noted. Extremities:   Without clubbing or edema. Neurologic:  Alert and  oriented x4;  grossly normal neurologically. Skin:  Intact without significant lesions or rashes. Cervical Nodes:  No significant cervical adenopathy. Psych:  Alert and cooperative. Normal mood and affect.  Lab Results:  Recent Labs  04/04/12 1650 04/05/12 0609  WBC 14.3* 11.7*  HGB 13.2 12.0*  HCT 38.6* 35.6*  PLT 310 317   BMET  Recent Labs  04/04/12 1650 04/05/12 0609  NA 136 135  K 3.2* 3.4*  CL 96 98  CO2 25 25  GLUCOSE 103* 122*  BUN 8 13  CREATININE 0.85 0.79  CALCIUM 9.3 8.9   LFT  Recent Labs  04/05/12 0609  PROT 6.4  ALBUMIN 2.7*  AST 90*  ALT 142*  ALKPHOS 283*  BILITOT 5.8*  BILIDIR 4.2*  IBILI 1.6*  Nm Hepatobiliary  04/04/2012  *RADIOLOGY REPORT*  Clinical Data:  Abdominal pain.  Cholecystectomy on 03/31/2012  NUCLEAR MEDICINE HEPATOBILIARY IMAGING  Technique:  Sequential images of the abdomen were obtained out to 60 minutes following intravenous administration of radiopharmaceutical.  Radiopharmaceutical:  5 mCi Tc-54m mebrofenin  Comparison:  None.  Findings: The agent is retained extensively in the liver through 120 minutes.  However, there is activity seen in the small bowel at 60 minutes.  This indicates that the common bile duct is patent.  I suspect the patient has  hepatocellular dysfunction.  There is no evidence of bile leak.  IMPRESSION:  1.  No evidence of bile leak. 2.  Abnormal retention of the agent within the liver suggesting hepatocellular dysfunction.   Original Report Authenticated By: Francene Boyers, M.D.    US Abdomen Limited Ruq  04/04/2012  *RADIOLOGY REPORT*  Clinical Data:  Status post cholecystectomy 4 days ago.  Ductal dilation.  Fever and abdominal pain.  LIMITED ABDOMINAL ULTRASOUND - RIGHT UPPER QUADRANT  Comparison:  Preoperative ultrasound 03/25/2012.  Findings:  Gallbladder:  Surgically absent.  Common bile duct:  Normal in caliber. No biliary ductal dilation. The maximal  diameters 5.2 ml.  Liver:  Mild diffuse fatty infiltration is again noted.  A heterogeneous collection is present in the gallbladder fossa, measuring 6.7 x 2.6 x 2.4 cm.  IMPRESSION:  1.  Status post cholecystectomy. 2.  Heterogeneous collection in the gallbladder fossa following surgery.  This could represent hemorrhage.  A biliary leak or infection is not excluded.  No significant parenchymal  changes are evident adjacent to this area.                    Original Report Authenticated By: Marin Roberts, M.D.       Impression:  Very pleasant 52 year old gentleman status post laparoscopic cholecystectomy for symptomatic cholelithiasis on February 24 now presenting with ongoing right upper quadrant abdominal pain and jaundice. Hepatic function profile most consistent with partial extrahepatic biliary obstruction.  Bile duct not yet dilated on most recent ultrasound. No obvious bile leak on HIDA, however, he did have a heterogeneous collection in the gallbladder fossa which may be nothing more than gel foam.  A small low-grade biliary leak is not absolutely excluded based on a HIDA. Tracer flow into the duodenum was not at all robust by my interpretation. Tracer retention in the liver was impressive but I do not believe this gentleman has any degree of occult advanced liver disease.  Most likely patient has a partial extrahepatic biliary obstruction either secondary to a common bile duct stone or bile duct injury relating to cholecystectomy.  Recommendations:  This gentleman needs an urgent ERCP to evaluate and manage his symptoms. I discussed in some detail the potential for sphincterotomy stone extraction and/or stent placement. If he has a postoperative biliary stricture, I've explained to patient and family the goal would be to get across a stricture, dilate it and place 1 or more biliary stents.  If this were to be the scenario, then subsequent ERCPs would be needed. If there is a stricture, and I am  unable to traverse it, then the patient will need referral for either surgical and/or percutaneous intervention. The risks, benefits, limitations, alternatives, and imponderable have been reviewed with the patient. I specifically discussed a1 in 10 chance of pancreatitis, reaction to medications, bleeding, perforation and the possibility of a failed ERCP. Questions have been answered. All parties agreeable.  I'd like to thank Dr. Lovell Sheehan for allowing me to see this nice gentleman today.

## 2012-04-05 NOTE — Transfer of Care (Signed)
Immediate Anesthesia Transfer of Care Note  Patient: Jacob Douglas  Procedure(s) Performed: Procedure(s): ENDOSCOPIC RETROGRADE CHOLANGIOPANCREATOGRAPHY (ERCP) (N/A) SPHINCTEROTOMY (N/A) BALLOON DILATION (N/A)  Patient Location: PACU  Anesthesia Type:General  Level of Consciousness: awake, alert  and oriented  Airway & Oxygen Therapy: Patient Spontanous Breathing and Patient connected to face mask oxygen  Post-op Assessment: Report given to PACU RN  Post vital signs: Reviewed and stable  Complications: No apparent anesthesia complications

## 2012-04-05 NOTE — Anesthesia Postprocedure Evaluation (Addendum)
  Anesthesia Post-op Note  Patient: Jacob Douglas  Procedure(s) Performed: Procedure(s): ENDOSCOPIC RETROGRADE CHOLANGIOPANCREATOGRAPHY (ERCP) (N/A) SPHINCTEROTOMY (N/A) BALLOON DILATION (N/A)  Patient Location: PACU  Anesthesia Type:General  Level of Consciousness: awake, alert  and oriented  Airway and Oxygen Therapy: Patient Spontanous Breathing and Patient connected to face mask oxygen  Post-op Pain: none  Post-op Assessment: Post-op Vital signs reviewed, Patient's Cardiovascular Status Stable, Respiratory Function Stable, Patent Airway and No signs of Nausea or vomiting  Post-op Vital Signs: Reviewed and stable  Complications: No apparent anesthesia complications 04/06/12 VSS, continues to co upper abd pain.  Spoke with nurse and she will start patient on incentive spirometry.  Denies PONV. No apparent anesthesia complications.

## 2012-04-05 NOTE — Anesthesia Preprocedure Evaluation (Addendum)
Anesthesia Evaluation  Patient identified by MRN, date of birth, ID band Patient awake    Reviewed: Allergy & Precautions, H&P , NPO status , Patient's Chart, lab work & pertinent test results  History of Anesthesia Complications Negative for: history of anesthetic complications  Airway Mallampati: II TM Distance: >3 FB Neck ROM: Full    Dental  (+) Teeth Intact   Pulmonary          Cardiovascular hypertension, Pt. on medications Rhythm:Regular Rate:Normal     Neuro/Psych Anxiety    GI/Hepatic GERD-  Medicated and Controlled,  Endo/Other    Renal/GU      Musculoskeletal   Abdominal   Peds  Hematology   Anesthesia Other Findings   Reproductive/Obstetrics                         Anesthesia Physical Anesthesia Plan  ASA: II and emergent  Anesthesia Plan: General   Post-op Pain Management:    Induction: Intravenous, Rapid sequence and Cricoid pressure planned  Airway Management Planned: Oral ETT  Additional Equipment:   Intra-op Plan:   Post-operative Plan: Extubation in OR  Informed Consent: I have reviewed the patients History and Physical, chart, labs and discussed the procedure including the risks, benefits and alternatives for the proposed anesthesia with the patient or authorized representative who has indicated his/her understanding and acceptance.     Plan Discussed with: Anesthesiologist  Anesthesia Plan Comments: (Telephone consult with Dr. Jayme Cloud. GOT/RSI.)       Anesthesia Quick Evaluation

## 2012-04-05 NOTE — Progress Notes (Signed)
This writer was called into patient's room, patient verbalized blood in urine. Patient voided into toilet and urine was blood tinged I did not see any clots. I informed patient that MD will be made aware and to void into urinal next time. Will continue to monitor.

## 2012-04-05 NOTE — Anesthesia Procedure Notes (Signed)
Procedure Name: Intubation Date/Time: 04/05/2012 11:20 AM Performed by: Glynn Octave E Pre-anesthesia Checklist: Patient identified, Patient being monitored, Timeout performed, Emergency Drugs available and Suction available Patient Re-evaluated:Patient Re-evaluated prior to inductionOxygen Delivery Method: Circle System Utilized Preoxygenation: Pre-oxygenation with 100% oxygen Intubation Type: IV induction, Rapid sequence and Cricoid Pressure applied Laryngoscope Size: Mac and 3 Grade View: Grade I Tube type: Oral Tube size: 7.0 mm Number of attempts: 1 Airway Equipment and Method: stylet Placement Confirmation: ETT inserted through vocal cords under direct vision,  positive ETCO2 and breath sounds checked- equal and bilateral Secured at: 22 cm Tube secured with: Tape Dental Injury: Teeth and Oropharynx as per pre-operative assessment

## 2012-04-06 LAB — CBC
HCT: 34.6 % — ABNORMAL LOW (ref 39.0–52.0)
MCH: 30.7 pg (ref 26.0–34.0)
MCV: 90.8 fL (ref 78.0–100.0)
Platelets: 316 10*3/uL (ref 150–400)
RDW: 13.4 % (ref 11.5–15.5)
WBC: 10.7 10*3/uL — ABNORMAL HIGH (ref 4.0–10.5)

## 2012-04-06 LAB — HEPATIC FUNCTION PANEL
ALT: 159 U/L — ABNORMAL HIGH (ref 0–53)
Alkaline Phosphatase: 413 U/L — ABNORMAL HIGH (ref 39–117)
Bilirubin, Direct: 4.4 mg/dL — ABNORMAL HIGH (ref 0.0–0.3)
Indirect Bilirubin: 1.2 mg/dL — ABNORMAL HIGH (ref 0.3–0.9)

## 2012-04-06 LAB — BASIC METABOLIC PANEL
BUN: 9 mg/dL (ref 6–23)
Calcium: 8.8 mg/dL (ref 8.4–10.5)
Chloride: 97 mEq/L (ref 96–112)
Creatinine, Ser: 0.84 mg/dL (ref 0.50–1.35)
GFR calc Af Amer: >90 mL/min (ref >90)

## 2012-04-06 MED ORDER — CIPROFLOXACIN HCL 250 MG PO TABS
500.0000 mg | ORAL_TABLET | Freq: Two times a day (BID) | ORAL | Status: DC
  Administered 2012-04-06 – 2012-04-08 (×4): 500 mg via ORAL
  Filled 2012-04-06 (×4): qty 2

## 2012-04-06 MED ORDER — POLYETHYLENE GLYCOL 3350 17 G PO PACK
17.0000 g | PACK | Freq: Once | ORAL | Status: AC
  Administered 2012-04-06: 17 g via ORAL
  Filled 2012-04-06: qty 1

## 2012-04-06 MED ORDER — BISACODYL 5 MG PO TBEC
10.0000 mg | DELAYED_RELEASE_TABLET | Freq: Once | ORAL | Status: AC
  Administered 2012-04-06: 10 mg via ORAL
  Filled 2012-04-06: qty 2

## 2012-04-06 MED ORDER — POLYETHYLENE GLYCOL 3350 17 G PO PACK: 17.0000 g | PACK | Freq: Every day | ORAL | Status: DC

## 2012-04-06 MED ORDER — PANTOPRAZOLE SODIUM 40 MG PO TBEC
40.0000 mg | DELAYED_RELEASE_TABLET | Freq: Every day | ORAL | Status: DC
  Administered 2012-04-06 – 2012-04-07 (×2): 40 mg via ORAL
  Filled 2012-04-06 (×2): qty 1

## 2012-04-06 MED ORDER — KCL IN DEXTROSE-NACL 20-5-0.45 MEQ/L-%-% IV SOLN
INTRAVENOUS | Status: DC
  Administered 2012-04-06 – 2012-04-07 (×2): via INTRAVENOUS

## 2012-04-06 NOTE — Progress Notes (Signed)
1 Day Post-Op  Subjective: Feels much better. Does have some splinting on deep respiration secondary to incisional pain.  Objective: Vital signs in last 24 hours: Temp:  [98.5 F (36.9 C)-99.5 F (37.5 C)] 98.7 F (37.1 C) (03/02 0531) Pulse Rate:  [72-83] 78 (03/02 0531) Resp:  [15-26] 20 (03/02 0531) BP: (117-130)/(76-90) 128/81 mmHg (03/02 0531) SpO2:  [90 %-100 %] 93 % (03/02 0531) Last BM Date: 04/03/12  Intake/Output from previous day: 03/01 0701 - 03/02 0700 In: 1985.8 [P.O.:240; I.V.:1545.8; IV Piggyback:200] Out: 0  Intake/Output this shift:    General appearance: alert, cooperative and no distress Resp: clear to auscultation bilaterally Cardio: regular rate and rhythm, S1, S2 normal, no murmur, click, rub or gallop GI: soft, non-tender; bowel sounds normal; no masses,  no organomegaly and Incisions healing well.  Lab Results:   Recent Labs  04/05/12 0609 04/06/12 0528  WBC 11.7* 10.7*  HGB 12.0* 11.7*  HCT 35.6* 34.6*  PLT 317 316   BMET  Recent Labs  04/05/12 0609 04/06/12 0528  NA 135 137  K 3.4* 3.6  CL 98 97  CO2 25 30  GLUCOSE 122* 117*  BUN 13 9  CREATININE 0.79 0.84  CALCIUM 8.9 8.8   PT/INR No results found for this basename: LABPROT, INR,  in the last 72 hours  Studies/Results: Nm Hepatobiliary  04/04/2012  *RADIOLOGY REPORT*  Clinical Data:  Abdominal pain.  Cholecystectomy on 03/31/2012  NUCLEAR MEDICINE HEPATOBILIARY IMAGING  Technique:  Sequential images of the abdomen were obtained out to 60 minutes following intravenous administration of radiopharmaceutical.  Radiopharmaceutical:  5 mCi Tc-52m mebrofenin  Comparison:  None.  Findings: The agent is retained extensively in the liver through 120 minutes.  However, there is activity seen in the small bowel at 60 minutes.  This indicates that the common bile duct is patent.  I suspect the patient has  hepatocellular dysfunction.  There is no evidence of bile leak.  IMPRESSION:  1.  No  evidence of bile leak. 2.  Abnormal retention of the agent within the liver suggesting hepatocellular dysfunction.   Original Report Authenticated By: Francene Boyers, M.D.    Dg Ercp With Sphincterotomy  04/05/2012  *RADIOLOGY REPORT*  Clinical Data: History of recent cholecystectomy, now with obstructive jaundice.  ERCP  Comparison:  Abdominal ultrasound of 03/25/2012; HIDA scan - 04/04/2012  Findings:  20 spot intraoperative fluoroscopic radiographic images of the right upper quadrant during ERCP are provided for review.  Initial images demonstrates an ERCP probe overlying the right upper abdominal quadrant.  Cholecystectomy clips overlie the expected location of the gallbladder fossa.  Additional contrast injection demonstrates selective cannulation of the peripheral aspect of the residual cystic duct.  A low insertion of the cystic duct is incidentally noted.  Images 7 and 8 demonstrate minimal opacification of the central aspect of a nondilated pancreatic duct.  Subsequent images demonstrate contrast opacification of nondilated common bile duct.  No discrete filling defects are seen within the common bile duct.  While an image of the sphincterotomy balloon was not provided, Dr. Jena Gauss states that the common bile duct was swept several times with a sphincterotomy balloon.  Dr. Jena Gauss also relayed the presence of a large duodenal diverticulum as well as the presence of a likely recently passed a large cholesterol stone located within the duodenal diverticulum.  These findings are not apparent on the provided examination.  There is minimal opacification of the central aspect the nondilated intrahepatic biliary system.  Completion images (  labeled 20 and 21) demonstrate passage of the majority of the injected biliary contrast.  IMPRESSION: ERCP as above.  No discrete filling defects to suggest the presence of choledocholithiasis.  Images were interpreted and discussed with Dr. Jena Gauss at the time of submission.    Original Report Authenticated By: Tacey Ruiz, MD    US Abdomen Limited Ruq  04/04/2012  *RADIOLOGY REPORT*  Clinical Data:  Status post cholecystectomy 4 days ago.  Ductal dilation.  Fever and abdominal pain.  LIMITED ABDOMINAL ULTRASOUND - RIGHT UPPER QUADRANT  Comparison:  Preoperative ultrasound 03/25/2012.  Findings:  Gallbladder:  Surgically absent.  Common bile duct:  Normal in caliber. No biliary ductal dilation. The maximal diameters 5.2 ml.  Liver:  Mild diffuse fatty infiltration is again noted.  A heterogeneous collection is present in the gallbladder fossa, measuring 6.7 x 2.6 x 2.4 cm.  IMPRESSION:  1.  Status post cholecystectomy. 2.  Heterogeneous collection in the gallbladder fossa following surgery.  This could represent hemorrhage.  A biliary leak or infection is not excluded.  No significant parenchymal changes are evident adjacent to this area.                    Original Report Authenticated By: Marin Roberts, M.D.     Anti-infectives: Anti-infectives   Start     Dose/Rate Route Frequency Ordered Stop   04/06/12 2000  ciprofloxacin (CIPRO) tablet 500 mg     500 mg Oral 2 times daily 04/06/12 1010     04/04/12 1800  ciprofloxacin (CIPRO) IVPB 400 mg  Status:  Discontinued     400 mg 200 mL/hr over 60 Minutes Intravenous Every 12 hours 04/04/12 1643 04/06/12 1009      Assessment/Plan: s/p Procedure(s): ENDOSCOPIC RETROGRADE CHOLANGIOPANCREATOGRAPHY (ERCP) SPHINCTEROTOMY BALLOON DILATION Impression: Status post ERCP with retained sludge in common bile duct. No evidence of surgical complication. Appreciate Dr. Jena Gauss assistance. Jaundice still present, possible intrinsic liver disease. Clinically, patient seems to be improving. We'll recheck liver enzyme test in a.m. Dr. Ouida Sills aware.  LOS: 2 days    Sholonda Jobst A 04/06/2012

## 2012-04-06 NOTE — Progress Notes (Signed)
Patient oxygen 86% room air, O2 at 2lpm applied, sats 94%. Reported to oncoming nurse.

## 2012-04-06 NOTE — Progress Notes (Signed)
PHARMACIST - PHYSICIAN COMMUNICATION DR:   Lovell Sheehan and Rourk CONCERNING: Antibiotic IV to Oral Route Change Policy  RECOMMENDATION: This patient is receiving Cipro by the intravenous route.  Based on criteria approved by the Pharmacy and Therapeutics Committee, the antibiotic(s) is/are being converted to the equivalent oral dose form(s).   DESCRIPTION: These criteria include:  Patient being treated for a respiratory tract infection, urinary tract infection, or cellulitis  The patient is not neutropenic and does not exhibit a GI malabsorption state  The patient is eating (either orally or via tube) and/or has been taking other orally administered medications for a least 24 hours  The patient is improving clinically and has a Tmax < 100.5  If you have questions about this conversion, please contact the Pharmacy Department  [x]   [phone] )  [facility] []   [phone] )  [facility]  []   [phone] )  [facility] []   [phone] )  [facility]   S. Margo Aye, PharmD

## 2012-04-06 NOTE — Progress Notes (Signed)
The patient is receiving Protonix by the intravenous route.  Based on criteria approved by the Pharmacy and Therapeutics Committee and the Medical Executive Committee, the medication is being converted to the equivalent oral dose form.  These criteria include: -No Active GI bleeding -Able to tolerate diet of full liquids (or better) or tube feeding OR able to tolerate other medications by the oral or enteral route  If you have any questions about this conversion, please contact the Pharmacy Department (ext [phone]).  Thank you.  Jacob Douglas, [place] 04/06/2012 10:12 AM

## 2012-04-06 NOTE — Progress Notes (Signed)
Patient complaining of LUQ pain, not relieved by Dilaudid IV or PO. Dr. Jena Gauss notified. New orders for Miralax 17g x1 dose and dulcolax 10mg  x1 dose.

## 2012-04-06 NOTE — Progress Notes (Signed)
Patient states he has less pain than he did yesterday. He was started on a low-fat diet which he is tolerating very well. Diaphoretic last night-had the staff open a window-that's improved. He tells me has a little trouble taking a really deep breath since cholecystectomy.  Total bilirubin remains elevated 5.6 with direct 4.4. Transaminases and alkaline phosphatase modestly increased. Lytes look good. Hemoglobin 11.7/ WBC 10.7   Vital signs in last 24 hours: Temp:  [98.5 F (36.9 C)-99.5 F (37.5 C)] 98.7 F (37.1 C) (03/02 0531) Pulse Rate:  [72-83] 78 (03/02 0531) Resp:  [15-26] 20 (03/02 0531) BP: (117-130)/(76-90) 128/81 mmHg (03/02 0531) SpO2:  [90 %-100 %] 93 % (03/02 0531) Last BM Date: 04/03/12 General:   Alert. Comfortable appearing. Finishing off his breakfast tray.  Skin: Lightly jaundiced. Warm and dry nondistended.  Abdomen: Nondistended. Positive bowel sounds Soft with mild epigastric and right upper quadrant tenderness. Extremities:  Without clubbing or edema.    Intake/Output from previous day: 03/01 0701 - 03/02 0700 In: 1985.8 [P.O.:240; I.V.:1545.8; IV Piggyback:200] Out: 0  Intake/Output this shift:    Lab Results:  Recent Labs  04/04/12 1650 04/05/12 0609 04/06/12 0528  WBC 14.3* 11.7* 10.7*  HGB 13.2 12.0* 11.7*  HCT 38.6* 35.6* 34.6*  PLT 310 317 316   BMET  Recent Labs  04/04/12 1650 04/05/12 0609 04/06/12 0528  NA 136 135 137  K 3.2* 3.4* 3.6  CL 96 98 97  CO2 25 25 30   GLUCOSE 103* 122* 117*  BUN 8 13 9   CREATININE 0.85 0.79 0.84  CALCIUM 9.3 8.9 8.8   LFT  Recent Labs  04/06/12 0528  PROT 6.3  ALBUMIN 2.4*  AST 108*  ALT 159*  ALKPHOS 413*  BILITOT 5.6*  BILIDIR 4.4*  IBILI 1.2*     Impression:  He is overall clinically improved post ERCP. However, his bilirubin and liver enzymes remain elevated but it has been much less than 24 hours since biliary intervention. I do not detect anything concerning for post ERCP  complication or cholecystectomy complication for that matter. Given rather intense retention of tracer in the liver, I wonder if we are not seeing a manifestation of an element of chronic intrinsic liver disease. He may have some atelectasis.  Recommendations:  I see no reason why he can't continue his current diet for now. I would like to see him observed in the hospital another 24 hours and have a hepatic profile checked tomorrow morning. Incentive spirometry already ordered. I have discussed the case at length with Dr. Lovell Sheehan via telephone this morning.  Outpatient followup gastric polyps.

## 2012-04-07 ENCOUNTER — Inpatient Hospital Stay (HOSPITAL_COMMUNITY): Payer: Managed Care, Other (non HMO)

## 2012-04-07 DIAGNOSIS — K8050 Calculus of bile duct without cholangitis or cholecystitis without obstruction: Secondary | ICD-10-CM

## 2012-04-07 DIAGNOSIS — R7989 Other specified abnormal findings of blood chemistry: Secondary | ICD-10-CM

## 2012-04-07 DIAGNOSIS — R17 Unspecified jaundice: Secondary | ICD-10-CM

## 2012-04-07 LAB — URINE CULTURE: Colony Count: NO GROWTH

## 2012-04-07 LAB — BASIC METABOLIC PANEL
CO2: 29 mEq/L (ref 19–32)
Chloride: 94 mEq/L — ABNORMAL LOW (ref 96–112)
GFR calc Af Amer: >90 mL/min (ref >90)
Potassium: 3.2 mEq/L — ABNORMAL LOW (ref 3.5–5.1)
Sodium: 134 mEq/L — ABNORMAL LOW (ref 135–145)

## 2012-04-07 LAB — CBC
Platelets: 408 10*3/uL — ABNORMAL HIGH (ref 150–400)
RBC: 4.14 MIL/uL — ABNORMAL LOW (ref 4.22–5.81)
RDW: 13.2 % (ref 11.5–15.5)
WBC: 12.7 10*3/uL — ABNORMAL HIGH (ref 4.0–10.5)

## 2012-04-07 LAB — HEPATIC FUNCTION PANEL
ALT: 241 U/L — ABNORMAL HIGH (ref 0–53)
AST: 197 U/L — ABNORMAL HIGH (ref 0–37)
Albumin: 2.7 g/dL — ABNORMAL LOW (ref 3.5–5.2)
Alkaline Phosphatase: 534 U/L — ABNORMAL HIGH (ref 39–117)
Indirect Bilirubin: 1.8 mg/dL — ABNORMAL HIGH (ref 0.3–0.9)
Total Protein: 6.7 g/dL (ref 6.0–8.3)

## 2012-04-07 MED ORDER — IOHEXOL 300 MG/ML  SOLN
100.0000 mL | Freq: Once | INTRAMUSCULAR | Status: AC | PRN
  Administered 2012-04-07: 100 mL via INTRAVENOUS

## 2012-04-07 MED ORDER — METRONIDAZOLE IN NACL 5-0.79 MG/ML-% IV SOLN
500.0000 mg | Freq: Three times a day (TID) | INTRAVENOUS | Status: DC
  Administered 2012-04-07 – 2012-04-08 (×3): 500 mg via INTRAVENOUS
  Filled 2012-04-07 (×4): qty 100

## 2012-04-07 NOTE — Care Management Note (Signed)
    Page 1 of 1   04/08/2012     11:12:43 AM   CARE MANAGEMENT NOTE 04/08/2012  Patient:  PRESS, CASALE   Account Number:  [id number]  Date Initiated:  04/07/2012  Documentation initiated by:  Sharrie Rothman  Subjective/Objective Assessment:   Pt admitted from home with fever and abd pain. Pt is 1 week s/p lap choley. Pt lives with his wife and will return home at discharge. Pt is independent with ADL's.     Action/Plan:   No CM needs noted. Pt will be ready once liver enzymes trend down.   Anticipated DC Date:  04/10/2012   Anticipated DC Plan:  HOME/SELF CARE      DC Planning Services  CM consult      Choice offered to / List presented to:             Status of service:  Completed, signed off Medicare Important Message given?   (If response is "NO", the following Medicare IM given date fields will be blank) Date Medicare IM given:   Date Additional Medicare IM given:    Discharge Disposition:  HOME/SELF CARE  Per UR Regulation:    If discussed at Long Length of Stay Meetings, dates discussed:    Comments:  04/08/12 1111 Arlyss Queen, RN BSN CM Pt discharged home today. No CM needs noted.  04/07/12 1420 Arlyss Queen, RN BSN CM

## 2012-04-07 NOTE — Progress Notes (Signed)
Patient down in CAT scan for workup of worsening liver enzyme tests. Discussed with wife situation. She states that they did have exposure to foreign exchange student with hepatitis last year. Patient does drink alcohol, but denies any Tylenol use. Further management is pending test results. Discussed with GI.

## 2012-04-07 NOTE — Progress Notes (Addendum)
Subjective: Pt reports 4/10 pain.  Denies nausea or vomiting.  Has had 1/2 sausage biscuit this AM.  Denies pruritis.  +jaundice.  He is s/p cholecystectomy 1 week ago for cholelithiasis.  Hx fatty liver.  Significant transaminitis.  HIDA & ultrasound as below.  Total bilirubin, AST/ALT climbing.  S/p ERCP by Dr. Jena Gauss 2 days ago with sphincterotomy, normal cholangiogram, fish-mouthed ampulla, & duodenal diverticulum.  Pt reports had foreign exchange student last yr that had "hepatitis".  Drinks 4-5 4oz etoh beverages on weekends.    Objective: Vital signs in last 24 hours: Temp:  [98.4 F (36.9 C)-98.9 F (37.2 C)] 98.5 F (36.9 C) (03/03 0544) Pulse Rate:  [73-99] 73 (03/03 0544) Resp:  [20] 20 (03/03 0544) BP: (134-147)/(89-95) 147/91 mmHg (03/03 0544) SpO2:  [86 %-95 %] 95 % (03/03 0544) FiO2 (%):  [2 %] 2 % (03/03 0544) Last BM Date: 04/05/12 No LMP for male patient. Body mass index is 27.48 kg/(m^2). General:   Alert,  Well-developed, well-nourished, pleasant and cooperative in NAD.  +jaundice.  Wife at bedside. Eyes:  +jaundice Mouth: , oropharynx pink & moist. Neck:  Supple; no masses or thyromegaly. Heart:  Regular rate and rhythm; no murmurs, clicks, rubs,  or gallops. Abdomen:   Staples intact slight erythema at sites.  Normal bowel sounds.  Soft, nontender and nondistended. No masses, hepatosplenomegaly or hernias noted.  No guarding or rebound tenderness.   Msk:  Symmetrical without gross deformities. Pulses:  Normal pulses noted. Extremities:  Without clubbing or edema. Neurologic:  Alert and  oriented x4;  grossly normal neurologically. Skin:  Jaundice.  Intact without significant lesions or rashes. Cervical Nodes:  No significant cervical adenopathy. Psych:  Alert and cooperative. Normal mood and affect.  Intake/Output from previous day: 03/02 0701 - 03/03 0700 In: 1174.2 [P.O.:240; I.V.:934.2] Out: -   Lab Results:  Recent Labs  04/05/12 0609 04/06/12 0528  04/07/12 0602  WBC 11.7* 10.7* 12.7*  HGB 12.0* 11.7* 12.7*  HCT 35.6* 34.6* 37.3*  PLT 317 316 408*   BMET  Recent Labs  04/05/12 0609 04/06/12 0528 04/07/12 0602  NA 135 137 134*  K 3.4* 3.6 3.2*  CL 98 97 94*  CO2 25 30 29   GLUCOSE 122* 117* 130*  BUN 13 9 5*  CREATININE 0.79 0.84 0.67  CALCIUM 8.9 8.8 8.9   LFT  Recent Labs  04/04/12 1650 04/05/12 0609 04/06/12 0528 04/07/12 0602  PROT 7.1 6.4 6.3 6.7  ALBUMIN 3.0* 2.7* 2.4* 2.7*  AST 54* 90* 108* 197*  ALT 160* 142* 159* 241*  ALKPHOS 261* 283* 413* 534*  BILITOT 5.8* 5.8* 5.6* 6.5*  BILIDIR 4.0* 4.2* 4.4* 4.7*  IBILI 1.8* 1.6* 1.2* 1.8*  AMYLASE 36  --   --  31   Studies/Results: Dg Ercp With Sphincterotomy  04/05/2012  *RADIOLOGY REPORT*  Clinical Data: History of recent cholecystectomy, now with obstructive jaundice.  ERCP  Comparison:  Abdominal ultrasound of 03/25/2012; HIDA scan - 04/04/2012  Findings:  20 spot intraoperative fluoroscopic radiographic images of the right upper quadrant during ERCP are provided for review.  Initial images demonstrates an ERCP probe overlying the right upper abdominal quadrant.  Cholecystectomy clips overlie the expected location of the gallbladder fossa.  Additional contrast injection demonstrates selective cannulation of the peripheral aspect of the residual cystic duct.  A low insertion of the cystic duct is incidentally noted.  Images 7 and 8 demonstrate minimal opacification of the central aspect of a nondilated pancreatic duct.  Subsequent images demonstrate contrast opacification of nondilated common bile duct.  No discrete filling defects are seen within the common bile duct.  While an image of the sphincterotomy balloon was not provided, Dr. Jena Gauss states that the common bile duct was swept several times with a sphincterotomy balloon.  Dr. Jena Gauss also relayed the presence of a large duodenal diverticulum as well as the presence of a likely recently passed a large cholesterol  stone located within the duodenal diverticulum.  These findings are not apparent on the provided examination.  There is minimal opacification of the central aspect the nondilated intrahepatic biliary system.  Completion images (labeled 20 and 21) demonstrate passage of the majority of the injected biliary contrast.  IMPRESSION: ERCP as above.  No discrete filling defects to suggest the presence of choledocholithiasis.  Images were interpreted and discussed with Dr. Jena Gauss at the time of submission.   Original Report Authenticated By: Tacey Ruiz, MD    04/04/12 HIDA-1. No evidence of bile leak. 2. Abnormal retention of the agent within the liver suggesting hepatocellular dysfunction. ABD US-1. Status post cholecystectomy. 2. Heterogeneous collection in the gallbladder fossa following surgery. This could represent hemorrhage. A biliary leak or infection is not excluded. No significant parenchymal changes are evident adjacent to this area.  Assessment: 1. Transaminitis w/ direct hyperbilirubinemia & jaundice s/p cholecystectomy & ERCP as above.  No evidence or bile leak, cholangitis or pancreatitis.  ? Hepatic process concurrently would be unlikely.  Dr Jena Gauss has discussed imaging with radiologist.  MRCP contraindicated for 6 weeks post chole.  Will need CT. 2. Hypokalemia:  Per attending   Plan: 1. Pancreatic protocol CT now 2. Consider serologic liver work-up if CT does not show culprit 3. K repletion per attending 4. Continue supportive measures   LOS: 3 days   Lorenza Burton  04/07/2012, 8:29 AM   I have discussed the clinical scenario with both Drs. Rehman and Dr. Llana Aliment;  MRCP contraindicated for 6 weeks after implantation (clips).  Serum amylase post ERCP normal. Agree with CT to further evaluate, particularly with his symptoms of somewhat unusual left upper quadrant abdominal pain following cholecystectomy worsened when he takes a deep breath.  Will continue to follow closely.  CT  reviewed. Discuss with Dr. Lovell Sheehan. Focal abnormality gallbladder fossa likely represents Gelfoam packing. Liver/biliary tree and pancreas appear normal. No evidence for occult abscess, neoplasm, perforation etc.  Will continue to follow closely and we'll go ahead and draw markers for  viral and autoimmune hepatitis. Antibiotic regimen includes Flagyl and Cipro for now-we may be able to stop both soon.

## 2012-04-08 LAB — MITOCHONDRIAL ANTIBODIES: Mitochondrial M2 Ab, IgG: 0.17 (ref <0.91)

## 2012-04-08 LAB — HEPATITIS PANEL, ACUTE
HCV Ab: NEGATIVE
Hep A IgM: NEGATIVE
Hep B C IgM: NEGATIVE

## 2012-04-08 LAB — HEPATIC FUNCTION PANEL
AST: 153 U/L — ABNORMAL HIGH (ref 0–37)
Albumin: 2.5 g/dL — ABNORMAL LOW (ref 3.5–5.2)
Alkaline Phosphatase: 558 U/L — ABNORMAL HIGH (ref 39–117)
Total Bilirubin: 4.9 mg/dL — ABNORMAL HIGH (ref 0.3–1.2)

## 2012-04-08 LAB — CBC WITH DIFFERENTIAL/PLATELET
Basophils Relative: 1 % (ref 0–1)
Eosinophils Relative: 1 % (ref 0–5)
HCT: 37.3 % — ABNORMAL LOW (ref 39.0–52.0)
Hemoglobin: 12.7 g/dL — ABNORMAL LOW (ref 13.0–17.0)
MCHC: 34 g/dL (ref 30.0–36.0)
MCV: 89.9 fL (ref 78.0–100.0)
Monocytes Absolute: 1.2 10*3/uL — ABNORMAL HIGH (ref 0.1–1.0)
Monocytes Relative: 10 % (ref 3–12)
Neutro Abs: 9.3 10*3/uL — ABNORMAL HIGH (ref 1.7–7.7)

## 2012-04-08 LAB — IGG, IGA, IGM: IgM, Serum: 108 mg/dL (ref 41–251)

## 2012-04-08 LAB — ANA: Anti Nuclear Antibody(ANA): NEGATIVE

## 2012-04-08 NOTE — Discharge Summary (Signed)
Physician Discharge Summary  Patient ID: Jacob Douglas MRN: [id number] DOB/AGE: [birth year] 52 y.o.  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 *   Discharged Condition: good  Hospital Course: Patient is a 52 year old white male status post laparoscopic cholecystectomy for cholelithiasis on 03/31/2012 who presented to the hospital on 04/04/2012 with worsening upper abdominal pain. Liver tests were performed which revealed significant elevations in his liver enzyme tests as well as his total and direct bilirubin. Height scan was performed which revealed significant delay in excretion of the contrast, though the common bile duct was noted to be patent. the gallbladder revealed no significant biloma or hepatobiliary dilatation. the patient was admitted to the hospital for further evaluation treatment. Dr. Jena Gauss of GI was consulted. He underwent ERCP on 04/05/2012 and was found to have significant sludge within the common bile duct. No evidence of bile leak or stricture formation from the surgery was found. Interestingly, he had worsening liver enzyme tests and jaundice postoperatively. There was no evidence of pancreatitis. A CT scan of the abdomen was normal for postsurgical changes. He was monitored expectantly and today, his liver enzyme tests are improving. It is felt that he may have a primary hepatocellular disorder along with fatty liver. He is being discharged home in good improving condition. He will follow up in my office in 2 days for her liver enzyme tests.  Consults: GI  Significant Diagnostic Studies: HIDA scan, CT scan of abdomen, ultrasound of gallbladder,  Treatments: procedures: ERCP on 04/05/12  Discharge Exam: Blood  pressure 131/85, pulse 68, temperature 98.3 F (36.8 C), temperature source Oral, resp. rate 18, height 5\' 9"  (1.753 m), weight 84.46 kg (186 lb 3.2 oz), SpO2 92.00%. General appearance: alert, cooperative and no distress Resp: clear to auscultation bilaterally Cardio: regular rate and rhythm, S1, S2 normal, no murmur, click, rub or gallop GI: soft, non-tender; bowel sounds normal; no masses,  no organomegaly. Incisions healing well.  Disposition: 01-Home or Self Care     Medication List    TAKE these medications       ADVIL PM PO  Take by mouth.     ALPRAZolam 0.5 MG tablet  Commonly known as:  XANAX  Take 0.5 mg by mouth 2 (two) times daily as needed. Patient takes 1/2 tablet if needed, which is very rarely     amLODipine-benazepril 5-20 MG per capsule  Commonly known as:  LOTREL  Take 1 capsule by mouth daily.     diphenhydramine-acetaminophen 25-500 MG Tabs  Commonly known as:  TYLENOL PM  Take 1 tablet by mouth at bedtime as needed (for sleep).     multivitamin with minerals Tabs  Take 1 tablet by mouth daily.     oxyCODONE-acetaminophen 7.5-325 MG per tablet  Commonly known as:  PERCOCET  Take 1-2 tablets by mouth every 4 (four) hours as needed for pain.     pantoprazole 40 MG tablet  Commonly known as:  PROTONIX  Take 40 mg by mouth daily.           Follow-up Information   Follow up with Dalia Heading, MD On 04/10/2012. (9:30am)    Contact information:   [address] [place] [address] [phone]       Signed: Franky Macho A 04/08/2012, 9:17 AM

## 2012-04-08 NOTE — Plan of Care (Signed)
Problem: Discharge Progression Outcomes Goal: Discharge plan in place and appropriate Outcome: Adequate for Discharge Pt d/c home with wife, d/c instructions given to pt and family, both verbalized understanding of instructions.

## 2012-04-08 NOTE — Progress Notes (Signed)
UR Chart Review Completed  

## 2012-04-09 NOTE — Progress Notes (Signed)
Quick Note:  Please let pt know his hepatitis panel was normal. Other liver tests ok. Please be sure pt has FU OV w/ Dr Karilyn Cota. ZO:XWRUE,AVW, MD  ______

## 2012-04-10 NOTE — Progress Notes (Signed)
Quick Note:  Tried to call pt- LMOM ______ 

## 2012-04-11 NOTE — Progress Notes (Signed)
Quick Note:  Jacob Douglas,please make FU hospitalization re: elevated LFTS per RMR below. Thanks ______

## 2012-04-14 ENCOUNTER — Encounter (HOSPITAL_COMMUNITY): Payer: Self-pay | Admitting: Internal Medicine

## 2012-04-29 ENCOUNTER — Encounter: Payer: Self-pay | Admitting: Internal Medicine

## 2012-04-29 ENCOUNTER — Encounter (INDEPENDENT_AMBULATORY_CARE_PROVIDER_SITE_OTHER): Payer: Self-pay

## 2012-05-09 ENCOUNTER — Ambulatory Visit (INDEPENDENT_AMBULATORY_CARE_PROVIDER_SITE_OTHER): Payer: Managed Care, Other (non HMO) | Admitting: Internal Medicine

## 2012-05-09 ENCOUNTER — Encounter: Payer: Self-pay | Admitting: Internal Medicine

## 2012-05-09 VITALS — BP 140/87 | HR 74 | Temp 98.2°F | Ht 69.0 in | Wt 183.4 lb

## 2012-05-09 DIAGNOSIS — K8020 Calculus of gallbladder without cholecystitis without obstruction: Secondary | ICD-10-CM

## 2012-05-09 DIAGNOSIS — R748 Abnormal levels of other serum enzymes: Secondary | ICD-10-CM

## 2012-05-09 DIAGNOSIS — R7401 Elevation of levels of liver transaminase levels: Secondary | ICD-10-CM

## 2012-05-09 NOTE — Patient Instructions (Addendum)
We need to review your liver enzymes to be drawn in 2 weeks(Dr. Lovell Sheehan)  Office visit in 8 weeks to re-assess and set up EGD to look at stomach polyps    CC: PCP

## 2012-05-09 NOTE — Progress Notes (Signed)
Primary Care Physician:  Carylon Perches, MD Primary Gastroenterologist:  Dr. Karilyn Cota  Pre-Procedure History & Physical: HPI:  Jacob Douglas is a 52 y.o. male here for followup from a prolonged hospitalization related to cholecystectomy (cholelithiasis/cholecystitis) and probable passage of a common duct stone. ERCP with sphincterotomy and balloon sweeping of the bile duct performed during his hospitalization. Liver enzymes/bilirubin remains elevated and drifted down although slowly out of proportion to what would been seen for simple extrahepatic biliary obstruction. HIDA scan (to rule out leak/bile duct injury) demonstrated intense uptake of tracer in the hepatic parenchyma suggestive of intrinsic chronic liver disease. Autoimmune markers were not elevated. Viral markers negative. Patient admits to consumption of 3-4 mixed drinks on the weekend occasionally during the week. No family history of liver disease. He has not had a recent set of liver enzymes and tells me Dr. Christella Hartigan is planning to recheck him in 2 weeks. He did have significant gastric polyp seen with passage of the side view of the scope during his prior to the ERCP.  He's done well no problems with eating no clay-colored stools dark-colored urine. He denies recurrence of jaundice. No fever chills. He feels well.  Past Medical History  Diagnosis Date  . Seasonal allergies   . Hypertension   . GERD (gastroesophageal reflux disease)   . Anxiety   . Multiple gastric polyps   . Tubular adenoma     Past Surgical History  Procedure Laterality Date  . Back surgery  2002  . Colonoscopy  05/17/2011    Dr. Karilyn Cota- tubular adenoma at ileocecal valve  . Cholecystectomy N/A 03/31/2012    Procedure: LAPAROSCOPIC CHOLECYSTECTOMY;  Surgeon: Dalia Heading, MD;  Location: [facility];  Service: General;  Laterality: N/A;  . Umbilical hernia repair N/A 03/31/2012    Procedure: HERNIA REPAIR UMBILICAL ADULT;  Surgeon: Dalia Heading, MD;  Location: [facility];   Service: General;  Laterality: N/A;  . Ercp N/A 04/05/2012    Dr. Gae Bon ampullary duodenal diverticulum- probable stone/sludge material adjacent to the ampulla, normal appearing residual biliary tree with low cystic duct takeoff, multiple gastric polyps  . Sphincterotomy N/A 04/05/2012    Procedure: SPHINCTEROTOMY;  Surgeon: Corbin Ade, MD;  Location: [facility];  Service: Gastroenterology;  Laterality: N/A;  . Balloon dilation N/A 04/05/2012    Procedure: BALLOON DILATION;  Surgeon: Corbin Ade, MD;  Location: [facility];  Service: Gastroenterology;  Laterality: N/A;    Prior to Admission medications   Medication Sig Start Date End Date Taking? Authorizing Provider  ALPRAZolam Prudy Feeler) 0.5 MG tablet Take 0.5 mg by mouth 2 (two) times daily as needed. Patient takes 1/2 tablet if needed, which is very rarely   Yes Historical Provider, MD  amLODipine-benazepril (LOTREL) 5-20 MG per capsule Take 1 capsule by mouth daily.   Yes Historical Provider, MD  diphenhydramine-acetaminophen (TYLENOL PM) 25-500 MG TABS Take 1 tablet by mouth at bedtime as needed (for sleep).    Yes Historical Provider, MD  Ibuprofen-Diphenhydramine Cit (ADVIL PM PO) Take by mouth.   Yes Historical Provider, MD  Multiple Vitamin (MULITIVITAMIN WITH MINERALS) TABS Take 1 tablet by mouth daily.   Yes Historical Provider, MD  oxyCODONE-acetaminophen (PERCOCET) 7.5-325 MG per tablet Take 1-2 tablets by mouth every 4 (four) hours as needed for pain. 03/31/12 03/31/13 Yes Dalia Heading, MD  pantoprazole (PROTONIX) 40 MG tablet Take 40 mg by mouth daily.   Yes Historical Provider, MD    Allergies as of 05/09/2012 - Review  Complete 05/09/2012  Allergen Reaction Noted  . Penicillins Rash 05/17/2011    Family History  Problem Relation Age of Onset  . Colon cancer Neg Hx   . Hypertension Father   . Heart attack Other     History   Social History  . Marital Status: Married    Spouse Name: N/A    Number of Children: N/A  .  Years of Education: N/A   Occupational History  . Not on file.   Social History Main Topics  . Smoking status: Never Smoker   . Smokeless tobacco: Not on file  . Alcohol Use: 2.0 oz/week    4 drink(s) per week     Comment: occasional, social/weekends  . Drug Use: No  . Sexually Active: Not on file   Other Topics Concern  . Not on file   Social History Narrative  . No narrative on file    Review of Systems: See HPI, otherwise negative ROS  Physical Exam: BP 140/87  Pulse 74  Temp(Src) 98.2 F (36.8 C) (Oral)  Ht 5\' 9"  (1.753 m)  Wt 183 lb 6.4 oz (83.19 kg)  BMI 27.07 kg/m2 General:   Alert,  Well-developed, well-nourished, pleasant and cooperative in NAD Skin:  Intact without significant lesions or rashes. Is not jaundiced. He does have prominent malar telangiectasias however Eyes:  Sclera clear, no icterus.   Conjunctiva pink. Ears:  Normal auditory acuity. Nose:  No deformity, discharge,  or lesions. Mouth:  No deformity or lesions. Neck:  Supple; no masses or thyromegaly. No significant cervical adenopathy. Lungs:  Clear throughout to auscultation.   No wheezes, crackles, or rhonchi. No acute distress. Heart:  Regular rate and rhythm; no murmurs, clicks, rubs,  or gallops. Abdomen: Nondistended. Laparoscopy port sites are healing very well. Positive bowel sounds.  Soft and nontender without appreciable mass or hepatosplenomegaly.  Pulses:  Normal pulses noted. Extremities:  Without clubbing or edema.  Impression/Plan:  52 year old gentleman status post cholecystectomy course complicated by markedly elevated LFTs suggestive of extrahepatic biliary obstruction. He likely passed a small stone/sludge. Status post ERCP with sphincterotomy and balloon dredging of the bile duct. Gastric polyps to be investigated further.  We need to reassess LFTs. Those labs are planned for 2 weeks from now. I would continue to be concerned about the possibility of some degree of  pre-existing occult underlying of liver disease. (Question EtOH related) .Nothing to suggest advanced chronic liver disease including cirrhosis based on imaging done to date.  Recommendations: Proceed with hepatic function profile to be done in 2 weeks by Dr. Lovell Sheehan.  We will plan to bring him back in 8 weeks to set up an EGD to followup on gastric polyps.

## 2012-06-24 ENCOUNTER — Other Ambulatory Visit: Payer: Self-pay | Admitting: Internal Medicine

## 2012-06-24 ENCOUNTER — Ambulatory Visit (INDEPENDENT_AMBULATORY_CARE_PROVIDER_SITE_OTHER): Payer: Managed Care, Other (non HMO) | Admitting: Internal Medicine

## 2012-06-24 ENCOUNTER — Encounter: Payer: Self-pay | Admitting: Internal Medicine

## 2012-06-24 VITALS — BP 133/79 | HR 77 | Temp 98.5°F | Ht 69.0 in | Wt 187.6 lb

## 2012-06-24 DIAGNOSIS — D131 Benign neoplasm of stomach: Secondary | ICD-10-CM

## 2012-06-24 DIAGNOSIS — K317 Polyp of stomach and duodenum: Secondary | ICD-10-CM | POA: Insufficient documentation

## 2012-06-24 NOTE — Patient Instructions (Addendum)
Schedule EGD to followup on gastric polyps  Will need Phenergan 25 mg IV prior to the procedure  Cc PCP

## 2012-06-24 NOTE — Progress Notes (Signed)
Primary Care Physician:  FAGAN,ROY, MD Primary Gastroenterologist:  Dr. Sania Noy  Pre-Procedure History & Physical: HPI:  Jacob Douglas is a 51 y.o. male here to set up EGD to evaluate multiple gastric polyps further. Multiple gastric polyps seen at [facility] several weeks ago. LFTs have completely normalized. He feels well. He tells me he drinks 3-4 mixed drinks on the weekends; sometimes more none during the week.  Past Medical History  Diagnosis Date  . Seasonal allergies   . Hypertension   . GERD (gastroesophageal reflux disease)   . Anxiety   . Multiple gastric polyps   . Tubular adenoma     Past Surgical History  Procedure Laterality Date  . Back surgery  2002  . Colonoscopy  05/17/2011    Dr. Rehman- tubular adenoma at ileocecal valve  . Cholecystectomy N/A 03/31/2012    Procedure: LAPAROSCOPIC CHOLECYSTECTOMY;  Surgeon: Mark A Jenkins, MD;  Location: [facility];  Service: General;  Laterality: N/A;  . Umbilical hernia repair N/A 03/31/2012    Procedure: HERNIA REPAIR UMBILICAL ADULT;  Surgeon: Mark A Jenkins, MD;  Location: [facility];  Service: General;  Laterality: N/A;  . Ercp N/A 04/05/2012    Dr. Amarylis Rovito-juxta ampullary duodenal diverticulum- probable stone/sludge material adjacent to the ampulla, normal appearing residual biliary tree with low cystic duct takeoff, multiple gastric polyps  . Sphincterotomy N/A 04/05/2012    Procedure: SPHINCTEROTOMY;  Surgeon: Ahjanae Cassel M Lewin Pellow, MD;  Location: [facility];  Service: Gastroenterology;  Laterality: N/A;  . Balloon dilation N/A 04/05/2012    Procedure: BALLOON DILATION;  Surgeon: Mccauley Diehl M Hutch Rhett, MD;  Location: [facility];  Service: Gastroenterology;  Laterality: N/A;    Prior to Admission medications   Medication Sig Start Date End Date Taking? Authorizing Provider  ALPRAZolam (XANAX) 0.5 MG tablet Take 0.5 mg by mouth 2 (two) times daily as needed. Patient takes 1/2 tablet if needed, which is very rarely   Yes Historical Provider, MD  amLODipine-benazepril  (LOTREL) 5-20 MG per capsule Take 1 capsule by mouth daily.   Yes Historical Provider, MD  fish oil-omega-3 fatty acids 1000 MG capsule Take 2 g by mouth daily.   Yes Historical Provider, MD  Ibuprofen-Diphenhydramine Cit (ADVIL PM PO) Take by mouth.   Yes Historical Provider, MD  Multiple Vitamin (MULITIVITAMIN WITH MINERALS) TABS Take 1 tablet by mouth daily.   Yes Historical Provider, MD  pantoprazole (PROTONIX) 40 MG tablet Take 40 mg by mouth daily.   Yes Historical Provider, MD  vitamin B-12 (CYANOCOBALAMIN) 1000 MCG tablet Take 1,000 mcg by mouth daily.   Yes Historical Provider, MD  diphenhydramine-acetaminophen (TYLENOL PM) 25-500 MG TABS Take 1 tablet by mouth at bedtime as needed (for sleep).     Historical Provider, MD  oxyCODONE-acetaminophen (PERCOCET) 7.5-325 MG per tablet Take 1-2 tablets by mouth every 4 (four) hours as needed for pain. 03/31/12 03/31/13  Mark A Jenkins, MD    Allergies as of 06/24/2012 - Review Complete 06/24/2012  Allergen Reaction Noted  . Penicillins Rash 05/17/2011    Family History  Problem Relation Age of Onset  . Colon cancer Neg Hx   . Hypertension Father   . Heart attack Other     History   Social History  . Marital Status: Married    Spouse Name: N/A    Number of Children: N/A  . Years of Education: N/A   Occupational History  . Not on file.   Social History Main Topics  . Smoking status: Never Smoker   .   Smokeless tobacco: Not on file  . Alcohol Use: 2.0 oz/week    4 drink(s) per week     Comment: occasional, social/weekends  . Drug Use: No  . Sexually Active: Not on file   Other Topics Concern  . Not on file   Social History Narrative  . No narrative on file    Review of Systems: See HPI, otherwise negative ROS  Physical Exam: BP 133/79  Pulse 77  Temp(Src) 98.5 F (36.9 C) (Oral)  Ht 5' 9" (1.753 m)  Wt 187 lb 9.6 oz (85.095 kg)  BMI 27.69 kg/m2 General:   Alert,  Well-developed, well-nourished, pleasant and  cooperative in NAD Skin: Patient has prominent malar telangiectasias:   Sclera clear, no icterus.   Conjunctiva pink. Ears:  Normal auditory acuity. Nose:  No deformity, discharge,  or lesions. Mouth:  No deformity or lesions. Neck:  Supple; no masses or thyromegaly. No significant cervical adenopathy. Lungs:  Clear throughout to auscultation.   No wheezes, crackles, or rhonchi. No acute distress. Heart:  Regular rate and rhythm; no murmurs, clicks, rubs,  or gallops. Abdomen: Non-distended, normal bowel sounds.  Soft and nontender without appreciable mass or hepatosplenomegaly.  Pulses:  Normal pulses noted. Extremities:  Without clubbing or edema.  Impression/Plan:  Multiple gastric polyps seen at [facility] recently. He really needs an EGD to further evaluate, biopsy, etc. The risks, benefits, limitations, alternatives and imponderables have been reviewed with the patient. Potential for esophageal dilation, biopsy, etc. have also been reviewed.  Questions have been answered. All parties agreeable.  As a separate issue, I pointed out to him that he may be drinking too much alcohol on the weekends and that could have contributed to the exuberant elevation in his LFTs during her recent hospitalization and, likewise, could have contributed to the findings on HIDA scan. I recommended he should curtail his alcohol use across the board.  Further recommendations to follow pending findings of the EGD. 

## 2012-07-10 ENCOUNTER — Encounter (HOSPITAL_COMMUNITY): Payer: Self-pay | Admitting: Pharmacy Technician

## 2012-07-23 ENCOUNTER — Encounter (HOSPITAL_COMMUNITY): Payer: Self-pay | Admitting: *Deleted

## 2012-07-23 ENCOUNTER — Ambulatory Visit (HOSPITAL_COMMUNITY)
Admission: RE | Admit: 2012-07-23 | Discharge: 2012-07-23 | Disposition: A | Discharge status: Home / Self Care | Payer: Managed Care, Other (non HMO) | Source: Ambulatory Visit | Attending: Internal Medicine | Admitting: Internal Medicine

## 2012-07-23 ENCOUNTER — Encounter (HOSPITAL_COMMUNITY)
Admission: RE | Disposition: A | Discharge status: Home / Self Care | Payer: Self-pay | Source: Ambulatory Visit | Attending: Internal Medicine

## 2012-07-23 DIAGNOSIS — D131 Benign neoplasm of stomach: Secondary | ICD-10-CM | POA: Insufficient documentation

## 2012-07-23 DIAGNOSIS — K317 Polyp of stomach and duodenum: Secondary | ICD-10-CM

## 2012-07-23 DIAGNOSIS — I10 Essential (primary) hypertension: Secondary | ICD-10-CM | POA: Insufficient documentation

## 2012-07-23 DIAGNOSIS — K449 Diaphragmatic hernia without obstruction or gangrene: Secondary | ICD-10-CM

## 2012-07-23 DIAGNOSIS — Z09 Encounter for follow-up examination after completed treatment for conditions other than malignant neoplasm: Secondary | ICD-10-CM | POA: Insufficient documentation

## 2012-07-23 HISTORY — PX: ESOPHAGOGASTRODUODENOSCOPY: SHX5428

## 2012-07-23 SURGERY — EGD (ESOPHAGOGASTRODUODENOSCOPY)
Anesthesia: Moderate Sedation

## 2012-07-23 MED ORDER — MEPERIDINE HCL 100 MG/ML IJ SOLN
INTRAMUSCULAR | Status: AC
  Filled 2012-07-23: qty 2

## 2012-07-23 MED ORDER — SODIUM CHLORIDE 0.9 % IV SOLN
INTRAVENOUS | Status: DC
  Administered 2012-07-23: 08:00:00 via INTRAVENOUS

## 2012-07-23 MED ORDER — PROMETHAZINE HCL 25 MG/ML IJ SOLN
25.0000 mg | Freq: Once | INTRAMUSCULAR | Status: AC
  Administered 2012-07-23: 25 mg via INTRAVENOUS

## 2012-07-23 MED ORDER — MIDAZOLAM HCL 5 MG/5ML IJ SOLN
INTRAMUSCULAR | Status: DC | PRN
  Administered 2012-07-23 (×2): 2 mg via INTRAVENOUS
  Administered 2012-07-23: 1 mg via INTRAVENOUS

## 2012-07-23 MED ORDER — MIDAZOLAM HCL 5 MG/5ML IJ SOLN
INTRAMUSCULAR | Status: AC
  Filled 2012-07-23: qty 10

## 2012-07-23 MED ORDER — STERILE WATER FOR IRRIGATION IR SOLN
Status: DC | PRN
  Administered 2012-07-23: 08:00:00

## 2012-07-23 MED ORDER — PROMETHAZINE HCL 25 MG/ML IJ SOLN
INTRAMUSCULAR | Status: AC
  Filled 2012-07-23: qty 1

## 2012-07-23 MED ORDER — MIDAZOLAM HCL 5 MG/5ML IJ SOLN
INTRAMUSCULAR | Status: AC
  Filled 2012-07-23: qty 5

## 2012-07-23 MED ORDER — SODIUM CHLORIDE 0.9 % IJ SOLN
INTRAMUSCULAR | Status: AC
  Filled 2012-07-23: qty 10

## 2012-07-23 MED ORDER — MEPERIDINE HCL 100 MG/ML IJ SOLN
INTRAMUSCULAR | Status: DC | PRN
  Administered 2012-07-23 (×2): 50 mg via INTRAVENOUS
  Administered 2012-07-23: 25 mg via INTRAVENOUS

## 2012-07-23 MED ORDER — ONDANSETRON HCL 4 MG/2ML IJ SOLN
INTRAMUSCULAR | Status: AC
  Filled 2012-07-23: qty 2

## 2012-07-23 MED ORDER — ONDANSETRON HCL 4 MG/2ML IJ SOLN
INTRAMUSCULAR | Status: DC | PRN
  Administered 2012-07-23: 4 mg via INTRAVENOUS

## 2012-07-23 MED ORDER — BUTAMBEN-TETRACAINE-BENZOCAINE 2-2-14 % EX AERO
INHALATION_SPRAY | CUTANEOUS | Status: DC | PRN
  Administered 2012-07-23: 2 via TOPICAL

## 2012-07-23 NOTE — Interval H&P Note (Signed)
    History and Physical Interval Note:  07/23/2012 8:17 AM  Jacob Douglas  has presented today for surgery, with the diagnosis of Follow up Gastric Polyps  The various methods of treatment have been discussed with the patient and family. After consideration of risks, benefits and other options for treatment, the patient has consented to  Procedure(s) with comments: ESOPHAGOGASTRODUODENOSCOPY (EGD) (N/A) - 8:30 as a surgical intervention .  The patient's history has been reviewed, patient examined, no change in status, stable for surgery.  I have reviewed the patient's chart and labs.  Questions were answered to the patient's satisfaction.     Molly Maduro Jonesha Tsuchiya  EGD per plan.The risks, benefits, limitations, alternatives and imponderables have been reviewed with the patient. Potential for esophageal dilation, biopsy, etc. have also been reviewed.  Questions have been answered. All parties agreeable.

## 2012-07-23 NOTE — Op Note (Signed)
[facility] [address] [place], [address]   ENDOSCOPY PROCEDURE REPORT  PATIENT: Harshith, Pursell  MR#: [id number] BIRTHDATE: 06-Jun-1960 , 51  yrs. old GENDER: Male ENDOSCOPIST: R.  Roetta Sessions, MD FACP FACG REFERRED BY:  Carylon Perches, M.D. PROCEDURE DATE:  07/23/2012 PROCEDURE:     EGD with gastric biopsy and snare polypectomy  INDICATIONS:     Multiple gastric polyps seen at recent [facility]  INFORMED CONSENT:   The risks, benefits, limitations, alternatives and imponderables have been discussed.  The potential for biopsy, esophogeal dilation, etc. have also been reviewed.  Questions have been answered.  All parties agreeable.  Please see the history and physical in the medical record for more information.  MEDICATIONS:   Versed 5 mg IV and Demerol 125 mg IV in divided doses. Cetacaine spray. Zofran 4 mg IV. Phenergan 25 mg IV.  DESCRIPTION OF PROCEDURE:   The EG-[id number] ([id number])  endoscope was introduced through the mouth and advanced to the second portion of the duodenum without difficulty or limitations.  The mucosal surfaces were surveyed very carefully during advancement of the scope and upon withdrawal.  Retroflexion view of the proximal stomach and esophagogastric junction was performed.      FINDINGS:     Normal esophagus. Stomach empty. Patient had numerous, hyperplastic appearing  polyps throughout the body and fundus of the stomach -  perhaps a couple of 100 of these polyps;  the largest being about 1-1.5 cm in diameter. Somewhat multilobulated. There was a small hiatal hernia. There was no ulcer or infiltrating process. The antrum was relatively free of polyps. Patent pylorus. Normal first and second portion of the duodenum.  THERAPEUTIC / DIAGNOSTIC MANEUVERS PERFORMED:  1 of the largest polyps was removed with hot snare cautery and submitted to pathologist. Multiple pinch biopsies of various other polyps taken for histologic  study.   COMPLICATIONS:  None  IMPRESSION:   Hiatal hernia. Multiple gastric polyps  - status post snare polypectomy and cold biopsy as described above  RECOMMENDATIONS:   No aspirin or arthritis medications 7 days. Followup on pathology. Further recommendations to follow.    _______________________________ R. Roetta Sessions, MD FACP [facility] eSigned:  R. Roetta Sessions, MD FACP [facility] 07/23/2012 9:03 AM     CC:  PATIENT NAME:  Khylan, Sawyer MR#: [id number]

## 2012-07-23 NOTE — H&P (View-Only) (Signed)
Primary Care Physician:  Carylon Perches, MD Primary Gastroenterologist:  Dr. Jena Gauss  Pre-Procedure History & Physical: HPI:  Jacob Douglas is a 52 y.o. male here to set up EGD to evaluate multiple gastric polyps further. Multiple gastric polyps seen at [facility] several weeks ago. LFTs have completely normalized. He feels well. He tells me he drinks 3-4 mixed drinks on the weekends; sometimes more none during the week.  Past Medical History  Diagnosis Date  . Seasonal allergies   . Hypertension   . GERD (gastroesophageal reflux disease)   . Anxiety   . Multiple gastric polyps   . Tubular adenoma     Past Surgical History  Procedure Laterality Date  . Back surgery  2002  . Colonoscopy  05/17/2011    Dr. Karilyn Cota- tubular adenoma at ileocecal valve  . Cholecystectomy N/A 03/31/2012    Procedure: LAPAROSCOPIC CHOLECYSTECTOMY;  Surgeon: Dalia Heading, MD;  Location: [facility];  Service: General;  Laterality: N/A;  . Umbilical hernia repair N/A 03/31/2012    Procedure: HERNIA REPAIR UMBILICAL ADULT;  Surgeon: Dalia Heading, MD;  Location: [facility];  Service: General;  Laterality: N/A;  . Ercp N/A 04/05/2012    Dr. Gae Bon ampullary duodenal diverticulum- probable stone/sludge material adjacent to the ampulla, normal appearing residual biliary tree with low cystic duct takeoff, multiple gastric polyps  . Sphincterotomy N/A 04/05/2012    Procedure: SPHINCTEROTOMY;  Surgeon: Corbin Ade, MD;  Location: [facility];  Service: Gastroenterology;  Laterality: N/A;  . Balloon dilation N/A 04/05/2012    Procedure: BALLOON DILATION;  Surgeon: Corbin Ade, MD;  Location: [facility];  Service: Gastroenterology;  Laterality: N/A;    Prior to Admission medications   Medication Sig Start Date End Date Taking? Authorizing Provider  ALPRAZolam Prudy Feeler) 0.5 MG tablet Take 0.5 mg by mouth 2 (two) times daily as needed. Patient takes 1/2 tablet if needed, which is very rarely   Yes Historical Provider, MD  amLODipine-benazepril  (LOTREL) 5-20 MG per capsule Take 1 capsule by mouth daily.   Yes Historical Provider, MD  fish oil-omega-3 fatty acids 1000 MG capsule Take 2 g by mouth daily.   Yes Historical Provider, MD  Ibuprofen-Diphenhydramine Cit (ADVIL PM PO) Take by mouth.   Yes Historical Provider, MD  Multiple Vitamin (MULITIVITAMIN WITH MINERALS) TABS Take 1 tablet by mouth daily.   Yes Historical Provider, MD  pantoprazole (PROTONIX) 40 MG tablet Take 40 mg by mouth daily.   Yes Historical Provider, MD  vitamin B-12 (CYANOCOBALAMIN) 1000 MCG tablet Take 1,000 mcg by mouth daily.   Yes Historical Provider, MD  diphenhydramine-acetaminophen (TYLENOL PM) 25-500 MG TABS Take 1 tablet by mouth at bedtime as needed (for sleep).     Historical Provider, MD  oxyCODONE-acetaminophen (PERCOCET) 7.5-325 MG per tablet Take 1-2 tablets by mouth every 4 (four) hours as needed for pain. 03/31/12 03/31/13  Dalia Heading, MD    Allergies as of 06/24/2012 - Review Complete 06/24/2012  Allergen Reaction Noted  . Penicillins Rash 05/17/2011    Family History  Problem Relation Age of Onset  . Colon cancer Neg Hx   . Hypertension Father   . Heart attack Other     History   Social History  . Marital Status: Married    Spouse Name: N/A    Number of Children: N/A  . Years of Education: N/A   Occupational History  . Not on file.   Social History Main Topics  . Smoking status: Never Smoker   .  Smokeless tobacco: Not on file  . Alcohol Use: 2.0 oz/week    4 drink(s) per week     Comment: occasional, social/weekends  . Drug Use: No  . Sexually Active: Not on file   Other Topics Concern  . Not on file   Social History Narrative  . No narrative on file    Review of Systems: See HPI, otherwise negative ROS  Physical Exam: BP 133/79  Pulse 77  Temp(Src) 98.5 F (36.9 C) (Oral)  Ht 5\' 9"  (1.753 m)  Wt 187 lb 9.6 oz (85.095 kg)  BMI 27.69 kg/m2 General:   Alert,  Well-developed, well-nourished, pleasant and  cooperative in NAD Skin: Patient has prominent malar telangiectasias:   Sclera clear, no icterus.   Conjunctiva pink. Ears:  Normal auditory acuity. Nose:  No deformity, discharge,  or lesions. Mouth:  No deformity or lesions. Neck:  Supple; no masses or thyromegaly. No significant cervical adenopathy. Lungs:  Clear throughout to auscultation.   No wheezes, crackles, or rhonchi. No acute distress. Heart:  Regular rate and rhythm; no murmurs, clicks, rubs,  or gallops. Abdomen: Non-distended, normal bowel sounds.  Soft and nontender without appreciable mass or hepatosplenomegaly.  Pulses:  Normal pulses noted. Extremities:  Without clubbing or edema.  Impression/Plan:  Multiple gastric polyps seen at [facility] recently. He really needs an EGD to further evaluate, biopsy, etc. The risks, benefits, limitations, alternatives and imponderables have been reviewed with the patient. Potential for esophageal dilation, biopsy, etc. have also been reviewed.  Questions have been answered. All parties agreeable.  As a separate issue, I pointed out to him that he may be drinking too much alcohol on the weekends and that could have contributed to the exuberant elevation in his LFTs during her recent hospitalization and, likewise, could have contributed to the findings on HIDA scan. I recommended he should curtail his alcohol use across the board.  Further recommendations to follow pending findings of the EGD.

## 2012-07-25 ENCOUNTER — Encounter (HOSPITAL_COMMUNITY): Payer: Self-pay | Admitting: Internal Medicine

## 2012-07-28 ENCOUNTER — Ambulatory Visit (HOSPITAL_COMMUNITY)
Admission: RE | Admit: 2012-07-28 | Discharge: 2012-07-28 | Disposition: A | Discharge status: Home / Self Care | Payer: Managed Care, Other (non HMO) | Source: Ambulatory Visit | Attending: Internal Medicine | Admitting: Internal Medicine

## 2012-07-28 DIAGNOSIS — I4949 Other premature depolarization: Secondary | ICD-10-CM | POA: Insufficient documentation

## 2012-07-28 DIAGNOSIS — I517 Cardiomegaly: Secondary | ICD-10-CM

## 2012-07-28 NOTE — Progress Notes (Signed)
*  PRELIMINARY RESULTS* Echocardiogram 2D Echocardiogram has been performed.  Jacob Douglas 07/28/2012, 3:14 PM

## 2012-07-30 ENCOUNTER — Telehealth: Payer: Self-pay | Admitting: Internal Medicine

## 2012-07-30 NOTE — Telephone Encounter (Signed)
Pt was checking to see if his results were available. Please call [phone] or [phone]

## 2012-07-31 ENCOUNTER — Telehealth: Payer: Self-pay | Admitting: Internal Medicine

## 2012-07-31 ENCOUNTER — Encounter: Payer: Self-pay | Admitting: Internal Medicine

## 2012-07-31 MED ORDER — FAMOTIDINE 20 MG PO TABS: 20.0000 mg | ORAL_TABLET | Freq: Two times a day (BID) | ORAL | Status: DC

## 2012-07-31 NOTE — Telephone Encounter (Signed)
Letter from: Corbin Ade  Reason for Letter: Results Review Send letter to patient.  Send copy of letter with path to referring provider and PCP.  Raynelle Fanning, RX famotidine 20 mg BID for reflux; disp 60 w 11 refills

## 2012-07-31 NOTE — Telephone Encounter (Signed)
Tried to call pt- LMOM 

## 2012-07-31 NOTE — Telephone Encounter (Signed)
Spoke with pt

## 2012-07-31 NOTE — Telephone Encounter (Signed)
Pt aware, rx sent to pharmacy. 

## 2012-08-22 ENCOUNTER — Telehealth: Payer: Self-pay

## 2012-08-22 NOTE — Telephone Encounter (Signed)
Dr. Jena Gauss had recommended not taking PPI due to association with gastric polyps.   I would recommend discussing further with Dr. Jena Gauss. In meantime, patient can use TUMS prn with Pepcid.

## 2012-08-22 NOTE — Telephone Encounter (Signed)
Called pt. Left message to use Tums for now and will discuss with Dr. Jena Gauss later due to his previous recommendations.

## 2012-08-22 NOTE — Telephone Encounter (Signed)
PT left VM that he was recently put on Pepcid bid and it is not working. Said he still has heartburn and indigestion and wants to know what you advise! He will be in his office until 3:00 today and can be reached at that number at [phone].

## 2012-08-27 NOTE — Telephone Encounter (Signed)
Dr. Jena Gauss, what do you recommend for this pt?

## 2012-08-28 ENCOUNTER — Other Ambulatory Visit: Payer: Self-pay

## 2012-08-28 NOTE — Telephone Encounter (Signed)
If significant reflux symptoms, he needs a PPI. Feel the benefits would been outweigh the risks.  Would go with a low dose PPI first. Try Prevacid 15 mg daily. Dispense 30. One years refills.

## 2012-08-29 MED ORDER — LANSOPRAZOLE 15 MG PO CPDR: 15.0000 mg | DELAYED_RELEASE_CAPSULE | Freq: Every day | ORAL | Status: DC

## 2012-08-29 NOTE — Telephone Encounter (Signed)
Called and informed pt. Tried to call [employer] and many rings and no answer. ( They have had phone problems this week). Will try to send electronically.

## 2012-09-04 ENCOUNTER — Telehealth: Payer: Self-pay | Admitting: Internal Medicine

## 2012-09-04 MED ORDER — LANSOPRAZOLE 15 MG PO CPDR: 30.0000 mg | DELAYED_RELEASE_CAPSULE | Freq: Every day | ORAL | Status: DC

## 2012-09-04 NOTE — Telephone Encounter (Signed)
Pt called to see if we could change his rx to Prevacid 30 mg so his insurance will cover it. He said he has already spoken with [facility] and he will get OTC to hold him over until we can send in rx to [facility]. [phone]

## 2012-09-04 NOTE — Addendum Note (Signed)
Addended by: Nira Retort on: 09/04/2012 03:38 PM   Modules accepted: Orders

## 2012-09-04 NOTE — Telephone Encounter (Signed)
Done

## 2012-09-04 NOTE — Telephone Encounter (Signed)
Routing to refill box  

## 2012-09-25 ENCOUNTER — Telehealth: Payer: Self-pay | Admitting: *Deleted

## 2012-09-25 NOTE — Telephone Encounter (Signed)
Pt called stating the medication Dr. Jena Gauss wrote him and he can't get it through pharmacy. Please advise [phone], or [phone].

## 2012-09-30 MED ORDER — LANSOPRAZOLE 30 MG PO CPDR: 30.0000 mg | DELAYED_RELEASE_CAPSULE | Freq: Every day | ORAL | Status: DC

## 2012-09-30 NOTE — Telephone Encounter (Signed)
Done

## 2012-09-30 NOTE — Addendum Note (Signed)
Addended by: Nira Retort on: 09/30/2012 03:47 PM   Modules accepted: Orders

## 2012-09-30 NOTE — Telephone Encounter (Signed)
Spoke with Acupuncturist at [facility]- he said prevacid 15mg  is not covered at all because it is otc. Pt will need new rx for 30mg  sent to pharmacy.

## 2012-12-11 ENCOUNTER — Other Ambulatory Visit: Payer: Self-pay

## 2013-07-15 ENCOUNTER — Telehealth: Payer: Self-pay | Admitting: Gastroenterology

## 2013-07-15 NOTE — Telephone Encounter (Signed)
Spoke with pts wife. She said he is not taking both. He is waiting for a PA to be done for the protonix from Dr. Willey Blade, but they are aware that he is not to take both medications.

## 2013-07-15 NOTE — Telephone Encounter (Signed)
Received fax from insurance that patient was prescribed Prevacid by our practice and Protonix by Dr. Willey Blade recently. Please contact him and make sure he is not taking both.

## 2013-09-13 ENCOUNTER — Telehealth: Payer: Self-pay | Admitting: Internal Medicine

## 2013-09-13 NOTE — Telephone Encounter (Signed)
Discussed with Dr. Willey Blade 8/7;  GERD symptoms reported to be under better control with PPI rather than H2B therapy. He did have a multitude of fundal gland polyps -Largest removed at prior EGD.  Very low malignant potential even when assoc with FAP/MAP (no evidence of here given lack of history and 2013 TCS findings).  Suggest proceeding with PPI therapy sparingly (QD to QOD) as benefits probably outweigh risks.  There are no guidelines for surveillance endoscopy.  He should be seeing Dr. Laural Golden for a surveillance TCS in Spring of 2016.    Might consider a re-look in his stomach at that time.  Any new future GI symptoms referable to his UGI tract should be expeditiously evaluated.

## 2013-09-14 NOTE — Telephone Encounter (Signed)
Dr. Roseanne Kaufman note appreciated; He is on recall for surveillance colonoscopy in April, 2016 at which time may consider EGD.

## 2013-09-15 NOTE — Telephone Encounter (Signed)
EGD noted in recall for same time as TCS

## 2014-05-03 ENCOUNTER — Encounter (INDEPENDENT_AMBULATORY_CARE_PROVIDER_SITE_OTHER): Payer: Self-pay | Admitting: *Deleted

## 2014-05-26 ENCOUNTER — Other Ambulatory Visit (INDEPENDENT_AMBULATORY_CARE_PROVIDER_SITE_OTHER): Payer: Self-pay | Admitting: *Deleted

## 2014-05-26 DIAGNOSIS — Z860100 Personal history of colon polyps, unspecified: Secondary | ICD-10-CM

## 2014-05-26 DIAGNOSIS — K317 Polyp of stomach and duodenum: Secondary | ICD-10-CM

## 2014-07-08 ENCOUNTER — Telehealth (INDEPENDENT_AMBULATORY_CARE_PROVIDER_SITE_OTHER): Payer: Self-pay | Admitting: *Deleted

## 2014-07-08 NOTE — Telephone Encounter (Signed)
Patient needs trilyte 

## 2014-07-09 MED ORDER — PEG 3350-KCL-NA BICARB-NACL 420 G PO SOLR: 4000.0000 mL | Freq: Once | ORAL | Status: DC

## 2014-07-29 ENCOUNTER — Telehealth (INDEPENDENT_AMBULATORY_CARE_PROVIDER_SITE_OTHER): Payer: Self-pay | Admitting: *Deleted

## 2014-07-29 NOTE — Telephone Encounter (Signed)
Referring MD/PCP: fagan   Procedure: tcs/egd  Reason/Indication:  Hx polyps, hx gastric polyps  Has patient had this procedure before?  Yes tcs 2013 & egd 2014  If so, when, by whom and where?    Is there a family history of colon cancer?  no  Who?  What age when diagnosed?    Is patient diabetic?   no      Does patient have prosthetic heart valve?  no  Do you have a pacemaker?  no  Has patient ever had endocarditis? no  Has patient had joint replacement within last 12 months?  no  Does patient tend to be constipated or take laxatives? no  Is patient on Coumadin, Plavix and/or Aspirin? no  Medications: advil pm nightly, bc powder prn, protonix 20 mg daily, lotrel 5/20 mg daily  Allergies: pcn  Medication Adjustment:   Procedure date & time: 08/25/14 at 930

## 2014-08-02 NOTE — Telephone Encounter (Signed)
agree

## 2014-08-03 ENCOUNTER — Telehealth (INDEPENDENT_AMBULATORY_CARE_PROVIDER_SITE_OTHER): Payer: Self-pay | Admitting: *Deleted

## 2014-08-03 MED ORDER — PEG 3350-KCL-NA BICARB-NACL 420 G PO SOLR: 4000.0000 mL | Freq: Once | ORAL | Status: DC

## 2014-08-03 NOTE — Telephone Encounter (Signed)
Patient needs trilyte 

## 2014-08-25 ENCOUNTER — Ambulatory Visit (HOSPITAL_COMMUNITY)
Admission: RE | Admit: 2014-08-25 | Discharge: 2014-08-25 | Disposition: A | Discharge status: Home / Self Care | Payer: Managed Care, Other (non HMO) | Source: Ambulatory Visit | Attending: Internal Medicine | Admitting: Internal Medicine

## 2014-08-25 ENCOUNTER — Encounter (HOSPITAL_COMMUNITY): Payer: Self-pay | Admitting: *Deleted

## 2014-08-25 ENCOUNTER — Encounter (HOSPITAL_COMMUNITY)
Admission: RE | Disposition: A | Discharge status: Home / Self Care | Payer: Self-pay | Source: Ambulatory Visit | Attending: Internal Medicine

## 2014-08-25 DIAGNOSIS — Z880 Allergy status to penicillin: Secondary | ICD-10-CM | POA: Diagnosis not present

## 2014-08-25 DIAGNOSIS — D131 Benign neoplasm of stomach: Secondary | ICD-10-CM | POA: Diagnosis not present

## 2014-08-25 DIAGNOSIS — K317 Polyp of stomach and duodenum: Secondary | ICD-10-CM | POA: Diagnosis not present

## 2014-08-25 DIAGNOSIS — K219 Gastro-esophageal reflux disease without esophagitis: Secondary | ICD-10-CM | POA: Insufficient documentation

## 2014-08-25 DIAGNOSIS — K648 Other hemorrhoids: Secondary | ICD-10-CM | POA: Diagnosis not present

## 2014-08-25 DIAGNOSIS — Z86010 Personal history of colonic polyps: Secondary | ICD-10-CM

## 2014-08-25 DIAGNOSIS — Z1211 Encounter for screening for malignant neoplasm of colon: Secondary | ICD-10-CM | POA: Diagnosis present

## 2014-08-25 DIAGNOSIS — F419 Anxiety disorder, unspecified: Secondary | ICD-10-CM | POA: Diagnosis not present

## 2014-08-25 DIAGNOSIS — K649 Unspecified hemorrhoids: Secondary | ICD-10-CM | POA: Diagnosis not present

## 2014-08-25 DIAGNOSIS — Z79899 Other long term (current) drug therapy: Secondary | ICD-10-CM | POA: Insufficient documentation

## 2014-08-25 DIAGNOSIS — I10 Essential (primary) hypertension: Secondary | ICD-10-CM | POA: Diagnosis not present

## 2014-08-25 DIAGNOSIS — K6389 Other specified diseases of intestine: Secondary | ICD-10-CM | POA: Diagnosis not present

## 2014-08-25 HISTORY — PX: COLONOSCOPY: SHX5424

## 2014-08-25 HISTORY — PX: ESOPHAGOGASTRODUODENOSCOPY: SHX5428

## 2014-08-25 SURGERY — COLONOSCOPY
Anesthesia: Moderate Sedation

## 2014-08-25 MED ORDER — MEPERIDINE HCL 50 MG/ML IJ SOLN
INTRAMUSCULAR | Status: AC
  Filled 2014-08-25: qty 1

## 2014-08-25 MED ORDER — MIDAZOLAM HCL 5 MG/5ML IJ SOLN
INTRAMUSCULAR | Status: DC | PRN
  Administered 2014-08-25 (×3): 2 mg via INTRAVENOUS
  Administered 2014-08-25: 1 mg via INTRAVENOUS
  Administered 2014-08-25 (×4): 2 mg via INTRAVENOUS

## 2014-08-25 MED ORDER — MEPERIDINE HCL 50 MG/ML IJ SOLN
INTRAMUSCULAR | Status: DC | PRN
  Administered 2014-08-25 (×4): 25 mg via INTRAVENOUS

## 2014-08-25 MED ORDER — MIDAZOLAM HCL 5 MG/5ML IJ SOLN
INTRAMUSCULAR | Status: AC
  Filled 2014-08-25: qty 10

## 2014-08-25 MED ORDER — MIDAZOLAM HCL 5 MG/5ML IJ SOLN
INTRAMUSCULAR | Status: AC
  Filled 2014-08-25: qty 5

## 2014-08-25 MED ORDER — STERILE WATER FOR IRRIGATION IR SOLN
Status: DC | PRN
  Administered 2014-08-25: 09:00:00

## 2014-08-25 MED ORDER — SODIUM CHLORIDE 0.9 % IV SOLN
INTRAVENOUS | Status: DC
  Administered 2014-08-25: 09:00:00 via INTRAVENOUS

## 2014-08-25 MED ORDER — BUTAMBEN-TETRACAINE-BENZOCAINE 2-2-14 % EX AERO
INHALATION_SPRAY | CUTANEOUS | Status: DC | PRN
  Administered 2014-08-25: 1 via TOPICAL
  Administered 2014-08-25: 2 via TOPICAL

## 2014-08-25 NOTE — Discharge Instructions (Signed)
No aspirin or NSAIDs for 1 week. Resume usual medications and diet. No driving for 24 hours. Physician will call with biopsy results.       Colonoscopy, Care After These instructions give you information on caring for yourself after your procedure. Your doctor may also give you more specific instructions. Call your doctor if you have any problems or questions after your procedure. HOME CARE  Do not drive for 24 hours.  Do not sign important papers or use machinery for 24 hours.  You may shower.  You may go back to your usual activities, but go slower for the first 24 hours.  Take rest breaks often during the first 24 hours.  Walk around or use warm packs on your belly (abdomen) if you have belly cramping or gas.  Drink enough fluids to keep your pee (urine) clear or pale yellow.  Resume your normal diet. Avoid heavy or fried foods.  Avoid drinking alcohol for 24 hours or as told by your doctor.  Only take medicines as told by your doctor. If a tissue sample (biopsy) was taken during the procedure:   Do not take aspirin or blood thinners for 7 days, or as told by your doctor.  Do not drink alcohol for 7 days, or as told by your doctor.  Eat soft foods for the first 24 hours. GET HELP IF: You still have a small amount of blood in your poop (stool) 2-3 days after the procedure. GET HELP RIGHT AWAY IF:  You have more than a small amount of blood in your poop.  You see clumps of tissue (blood clots) in your poop.  Your belly is puffy (swollen).  You feel sick to your stomach (nauseous) or throw up (vomit).  You have a fever.  You have belly pain that gets worse and medicine does not help. MAKE SURE YOU:  Understand these instructions.  Will watch your condition.  Will get help right away if you are not doing well or get worse. Document Released: 02/24/2010 Document Revised: 01/27/2013 Document Reviewed: 09/29/2012 [facility] Patient Information 2015 [place],  [place]. This information is not intended to replace advice given to you by your health care provider. Make sure you discuss any questions you have with your health care provider.    Esophagogastroduodenoscopy Care After Refer to this sheet in the next few weeks. These instructions provide you with information on caring for yourself after your procedure. Your caregiver may also give you more specific instructions. Your treatment has been planned according to current medical practices, but problems sometimes occur. Call your caregiver if you have any problems or questions after your procedure.  HOME CARE INSTRUCTIONS  Do not eat or drink anything until the numbing medicine (local anesthetic) has worn off and your gag reflex has returned. You will know that the local anesthetic has worn off when you can swallow comfortably.  Do not drive for 12 hours after the procedure or as directed by your caregiver.  Only take medicines as directed by your caregiver. SEEK MEDICAL CARE IF:   You cannot stop coughing.  You are not urinating at all or less than usual. SEEK IMMEDIATE MEDICAL CARE IF:  You have difficulty swallowing.  You cannot eat or drink.  You have worsening throat or chest pain.  You have dizziness, lightheadedness, or you faint.  You have nausea or vomiting.  You have chills.  You have a fever.  You have severe abdominal pain.  You have black, tarry, or bloody  stools. Document Released: 01/09/2012 Document Reviewed: 01/09/2012 [facility] Patient Information 2015 [place]. This information is not intended to replace advice given to you by your health care provider. Make sure you discuss any questions you have with your health care provider.

## 2014-08-25 NOTE — H&P (Signed)
Jacob Douglas is an 54 y.o. male.   Chief Complaint: Patient is  here for EGD and colonoscopy. HPI: This 54 year old Caucasian male who is here EEG and colonoscopy. He has chronic GERD. He was found to have multiple gastric polyps at the time of ERCP over 2 years ago. He underwent EGD few weeks later and some of the polyps removed by Dr. 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 Attempt was made to get a more PPI but he did not do well with H2B. He is presently on Protonix and his heartburns well controlled with therapy. He denies dysphagia abdominal pain melena or rectal bleeding. He has history of colonic adenomas and last colonoscopy was about 3 years ago. Family history is negative for CRC.  Past Medical History  Diagnosis Date  . Seasonal allergies   . Hypertension   . GERD (gastroesophageal reflux disease)   . Anxiety   . Multiple gastric polyps   . Tubular adenoma     Past Surgical History  Procedure Laterality Date  . Back surgery  2002  . Colonoscopy  05/17/2011    Dr. Laural Golden- tubular adenoma at ileocecal valve  . Cholecystectomy N/A 03/31/2012    Procedure: LAPAROSCOPIC CHOLECYSTECTOMY;  Surgeon: Jamesetta So, MD;  Location: [facility];  Service: General;  Laterality: N/A;  . Umbilical hernia repair N/A 03/31/2012    Procedure: HERNIA REPAIR UMBILICAL ADULT;  Surgeon: Jamesetta So, MD;  Location: [facility];  Service: General;  Laterality: N/A;  . 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 Sphincterotomy N/A 04/05/2012    Procedure: SPHINCTEROTOMY;  Surgeon: Daneil Dolin, MD;  Location: [facility];  Service: Gastroenterology;  Laterality: N/A;  . Balloon dilation N/A 04/05/2012    Procedure: BALLOON DILATION;  Surgeon: Daneil Dolin, MD;  Location: [facility];  Service: Gastroenterology;  Laterality: N/A;  .  Esophagogastroduodenoscopy N/A 07/23/2012    Procedure: ESOPHAGOGASTRODUODENOSCOPY (EGD);  Surgeon: Daneil Dolin, MD;  Location: [facility];  Service: Endoscopy;  Laterality: N/A;  8:30    Family History  Problem Relation Age of Onset  . Colon cancer Neg Hx   . Hypertension Father   . Heart attack Other    Social History:  reports that he has never smoked. He does not have any smokeless tobacco history on file. He reports that he drinks about 2.0 oz of alcohol per week. He reports that he does not use illicit drugs.  Allergies:  Allergies  Allergen Reactions  . Penicillins Rash    Medications Prior to Admission  Medication Sig Dispense Refill  . ALPRAZolam (XANAX) 0.5 MG tablet Take 0.5 mg by mouth 2 (two) times daily as needed. Patient takes 1/2 tablet if needed, which is very rarely    . amLODipine-benazepril (LOTREL) 5-20 MG per capsule Take 1 capsule by mouth daily.    . diphenhydramine-acetaminophen (TYLENOL PM) 25-500 MG TABS Take 1 tablet by mouth at bedtime as needed (for sleep).     . pantoprazole (PROTONIX) 40 MG tablet Take 40 mg by mouth daily.    . polyethylene glycol-electrolytes (NULYTELY/GOLYTELY) 420 G solution Take 4,000 mLs by mouth once. 4000 mL 0    No results found for this or any previous visit (from the past 48 hour(s)). No results found.  ROS  Blood pressure 130/85, pulse 58, temperature  98.2 F (36.8 C), temperature source Oral, resp. rate 14, height 5\' 9"  (1.753 m), weight 176 lb (79.833 kg), SpO2 97 %. Physical Exam  Constitutional: He appears well-developed and well-nourished.  HENT:  Mouth/Throat: Oropharynx is clear and moist.  Eyes: Conjunctivae are normal. No scleral icterus.  Neck: No thyromegaly present.  Cardiovascular: Normal rate, regular rhythm and normal heart sounds.   No murmur heard. Respiratory: Effort normal and breath sounds normal.  GI: Soft. He exhibits no distension and no mass. There is no tenderness.  Musculoskeletal:  He exhibits no edema.  Lymphadenopathy:    He has no cervical adenopathy.  Neurological: He is alert.  Skin: Skin is warm and dry.     Assessment/Plan History of gastric and colonic polyps. Chronic GERD. EGD and colonoscopy.  Vanity Larsson U 08/25/2014, 9:10 AM

## 2014-08-25 NOTE — Op Note (Signed)
EGD AND COLONOSCOPY PROCEDURE REPORT  PATIENT:  Jacob Douglas  MR#:  [id number] Birthdate:  October 22, 1960, 54 y.o., male Endoscopist:  Dr. Rogene Houston, MD Referred By:  Dr. Asencion Noble, MD Procedure Date: 08/25/2014  Procedure:   EGD & Colonoscopy  Indications:  Patient is 54 year old Caucasian male who is here for surveillance EGD and colonoscopy. 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 He has not been able to come off PPI. She also has history of colonic adenomas and is undergoing surveillance colonoscopy.            Informed Consent:  The risks, benefits, alternatives & imponderables which include, but are not limited to, bleeding, infection, perforation, drug reaction and potential missed lesion have been reviewed.  The potential for biopsy, lesion removal, esophageal dilation, etc. have also been discussed.  Questions have been answered.  All parties agreeable.  Please see history & physical in medical record for more information.  Medications:  Demerol 100 mg IV Versed 15 mg IV Cetacaine spray topically for oropharyngeal anesthesia  EGD  Description of procedure:  The endoscope was introduced through the mouth and advanced to the second portion of the duodenum without difficulty or limitations. The mucosal surfaces were surveyed very carefully during advancement of the scope and upon withdrawal.  Findings:  Esophagus:  Mucosa of the esophagus was normal. GE junction was unremarkable. GEJ:  40 cm Stomach:  Stomach was empty and distended very well with insufflation. Numerous olives are noted at gastric body measuring 5-10 mm. Antral mucosa was normal. Pyloric channel was patent. Angularis was unremarkable and mucosa around cardia was normal. Duodenum:  Normal bulbar and post bulbar mucosa.  Therapeutic/Diagnostic Maneuvers Performed:   Three polyps or hot snared. One of these polyps was about 10 mm. 2 of  these polyps were retrieved using Jabier Mutton net and the third one was lost. There was oozing at one of the polypectomy sites and 360 clear was applied for hemostasis.  COLONOSCOPY Description of procedure:  After a digital rectal exam was performed, that colonoscope was advanced from the anus through the rectum and colon to the area of the cecum, ileocecal valve and appendiceal orifice. The cecum was deeply intubated. These structures were well-seen and photographed for the record. From the level of the cecum and ileocecal valve, the scope was slowly and cautiously withdrawn. The mucosal surfaces were carefully surveyed utilizing scope tip to flexion to facilitate fold flattening as needed. The scope was pulled down into the rectum where a thorough exam including retroflexion was performed.  Findings:   Prep excellent. Biopsy taken from ileocecal valve ileocecal valve. There was focal prominence possibly due to prolapse small bowel mucosa. Mucosa of rest of the colon was normal. Hemorrhoids noted above the dentate line.  Therapeutic/Diagnostic Maneuvers Performed:  see above  Complications:   None  Cecal Withdrawal Time:  11 minutes  Impression:   EGD findings; No evidence of erosive esophagitis. Multiple polyps ranging in size from 5-10 mm involving proximal stomach. Three polyps were hot snared and two were retrieved. Single 360 clip applied to one polypectomy site for hemostasis.  Comment; Endoscopic pictures from prior EGD reviewed and compared. There is no significant change in size of these polyps.  Colonoscopy findings; Examination performed to cecum. Focal mucosal irregularity at ileocecal valve possibly prolapse small bowel mucosa. Biopsy taken. No evidence of recurrent polyps. Internal hemorrhoids.   Recommendations:  Standard instructions given. No aspirin or NSAIDs for 1 week. I will be contacting patient with biopsy results and further recommendations.  REHMAN,NAJEEB U   08/25/2014 10:36 AM  CC: Dr. Asencion Noble, MD & Dr. Rayne Du ref. provider found

## 2014-08-27 ENCOUNTER — Encounter (HOSPITAL_COMMUNITY): Payer: Self-pay | Admitting: Internal Medicine

## 2014-08-30 ENCOUNTER — Telehealth (INDEPENDENT_AMBULATORY_CARE_PROVIDER_SITE_OTHER): Payer: Self-pay | Admitting: *Deleted

## 2014-08-30 NOTE — Telephone Encounter (Signed)
Patient has already been called.

## 2014-08-30 NOTE — Telephone Encounter (Signed)
FYI: Patient wants you to call him on his cell phone with biopsy results -- [phone] -- he is on vacation

## 2014-09-01 ENCOUNTER — Encounter (INDEPENDENT_AMBULATORY_CARE_PROVIDER_SITE_OTHER): Payer: Self-pay | Admitting: *Deleted

## 2014-10-06 ENCOUNTER — Other Ambulatory Visit
Admission: RE | Admit: 2014-10-06 | Discharge: 2014-10-06 | Disposition: A | Discharge status: Home / Self Care | Payer: Managed Care, Other (non HMO) | Source: Other Acute Inpatient Hospital | Attending: Ophthalmology | Admitting: Ophthalmology

## 2014-10-06 DIAGNOSIS — H16001 Unspecified corneal ulcer, right eye: Secondary | ICD-10-CM | POA: Diagnosis not present

## 2014-10-09 LAB — EYE CULTURE: Culture: NO GROWTH

## 2017-08-09 ENCOUNTER — Encounter (INDEPENDENT_AMBULATORY_CARE_PROVIDER_SITE_OTHER): Payer: Self-pay | Admitting: *Deleted

## 2017-08-23 ENCOUNTER — Other Ambulatory Visit (INDEPENDENT_AMBULATORY_CARE_PROVIDER_SITE_OTHER): Payer: Self-pay | Admitting: *Deleted

## 2017-08-23 DIAGNOSIS — K317 Polyp of stomach and duodenum: Secondary | ICD-10-CM

## 2017-09-10 ENCOUNTER — Encounter (INDEPENDENT_AMBULATORY_CARE_PROVIDER_SITE_OTHER): Payer: Self-pay | Admitting: *Deleted

## 2017-09-23 ENCOUNTER — Telehealth (INDEPENDENT_AMBULATORY_CARE_PROVIDER_SITE_OTHER): Payer: Self-pay | Admitting: *Deleted

## 2017-09-23 NOTE — Telephone Encounter (Signed)
Referring MD/PCP: fagan   Procedure: egd  Reason/Indication:  gastric polyp  Has patient had this procedure before?  Yes, 08/2014  If so, when, by whom and where?    Is there a family history of colon cancer?    Who?  What age when diagnosed?    Is patient diabetic?   no      Does patient have prosthetic heart valve or mechanical valve?  no  Do you have a pacemaker?  no  Has patient ever had endocarditis? no  Has patient had joint replacement within last 12 months?  no  Is patient constipated or do they take laxatives? no  Does patient have a history of alcohol/drug use?  no  Is patient on blood thinner such as Coumadin, Plavix and/or Aspirin? no  Medications: see epic  Allergies: PNC  Medication Adjustment per Dr Lindi Adie, NP:   Procedure date & time: 10/24/17 at 830

## 2017-09-24 NOTE — Telephone Encounter (Signed)
agree

## 2017-10-24 ENCOUNTER — Ambulatory Visit (HOSPITAL_COMMUNITY)
Admission: RE | Admit: 2017-10-24 | Discharge: 2017-10-24 | Disposition: A | Discharge status: Home / Self Care | Payer: 59 | Source: Ambulatory Visit | Attending: Internal Medicine | Admitting: Internal Medicine

## 2017-10-24 ENCOUNTER — Encounter (HOSPITAL_COMMUNITY)
Admission: RE | Disposition: A | Discharge status: Home / Self Care | Payer: Self-pay | Source: Ambulatory Visit | Attending: Internal Medicine

## 2017-10-24 ENCOUNTER — Other Ambulatory Visit: Payer: Self-pay

## 2017-10-24 ENCOUNTER — Encounter (HOSPITAL_COMMUNITY): Payer: Self-pay | Admitting: *Deleted

## 2017-10-24 DIAGNOSIS — Z8249 Family history of ischemic heart disease and other diseases of the circulatory system: Secondary | ICD-10-CM | POA: Diagnosis not present

## 2017-10-24 DIAGNOSIS — I10 Essential (primary) hypertension: Secondary | ICD-10-CM | POA: Diagnosis not present

## 2017-10-24 DIAGNOSIS — F419 Anxiety disorder, unspecified: Secondary | ICD-10-CM | POA: Insufficient documentation

## 2017-10-24 DIAGNOSIS — K317 Polyp of stomach and duodenum: Secondary | ICD-10-CM | POA: Insufficient documentation

## 2017-10-24 DIAGNOSIS — Z880 Allergy status to penicillin: Secondary | ICD-10-CM | POA: Insufficient documentation

## 2017-10-24 DIAGNOSIS — K219 Gastro-esophageal reflux disease without esophagitis: Secondary | ICD-10-CM | POA: Diagnosis not present

## 2017-10-24 DIAGNOSIS — Z79899 Other long term (current) drug therapy: Secondary | ICD-10-CM | POA: Diagnosis not present

## 2017-10-24 HISTORY — PX: ESOPHAGOGASTRODUODENOSCOPY: SHX5428

## 2017-10-24 HISTORY — PX: POLYPECTOMY: SHX5525

## 2017-10-24 SURGERY — EGD (ESOPHAGOGASTRODUODENOSCOPY)
Anesthesia: Moderate Sedation

## 2017-10-24 MED ORDER — MEPERIDINE HCL 50 MG/ML IJ SOLN
INTRAMUSCULAR | Status: AC
  Filled 2017-10-24: qty 1

## 2017-10-24 MED ORDER — LIDOCAINE VISCOUS HCL 2 % MT SOLN
OROMUCOSAL | Status: DC | PRN
  Administered 2017-10-24: 4 mL via OROMUCOSAL

## 2017-10-24 MED ORDER — MIDAZOLAM HCL 5 MG/5ML IJ SOLN
INTRAMUSCULAR | Status: DC | PRN
  Administered 2017-10-24 (×2): 2 mg via INTRAVENOUS
  Administered 2017-10-24 (×2): 3 mg via INTRAVENOUS

## 2017-10-24 MED ORDER — LIDOCAINE VISCOUS HCL 2 % MT SOLN
OROMUCOSAL | Status: AC
  Filled 2017-10-24: qty 15

## 2017-10-24 MED ORDER — MIDAZOLAM HCL 5 MG/5ML IJ SOLN
INTRAMUSCULAR | Status: AC
  Filled 2017-10-24: qty 10

## 2017-10-24 MED ORDER — SODIUM CHLORIDE 0.9 % IV SOLN
INTRAVENOUS | Status: DC
  Administered 2017-10-24: 08:00:00 via INTRAVENOUS

## 2017-10-24 MED ORDER — STERILE WATER FOR IRRIGATION IR SOLN
Status: DC | PRN
  Administered 2017-10-24: 10:00:00

## 2017-10-24 MED ORDER — MEPERIDINE HCL 50 MG/ML IJ SOLN
INTRAMUSCULAR | Status: DC | PRN
  Administered 2017-10-24 (×3): 25 mg via INTRAVENOUS

## 2017-10-24 MED ORDER — MIDAZOLAM HCL 5 MG/5ML IJ SOLN
INTRAMUSCULAR | Status: AC
  Filled 2017-10-24: qty 5

## 2017-10-24 NOTE — Discharge Instructions (Signed)
Gastric Polyps A gastric polyp, also called a stomach polyp, is a growth on the lining of the stomach. Most polyps are not dangerous, but some can be harmful because of their size, location, or type. Polyps that can become harmful include:  Large polyps. These can turn into sores (ulcers). Ulcers can lead to stomach bleeding.  Polyps that block food from moving from the stomach to the small intestine (gastric outlet obstruction).  A type of polyp called an adenoma. This type of polyp can become cancerous.  What are the causes? Gastric polyps form when the lining of the stomach gets inflamed or damaged. Stomach inflammation and damage may be caused by:  A long-lasting stomach condition, such as gastritis.  Certain medicines used to reduce stomach acid.  An inherited condition called familial adenomatous polyposis.  What are the signs or symptoms? Usually, this condition does not cause any symptoms. If you do have symptoms, they may include:  Pain or tenderness in the abdomen.  Nausea.  Trouble eating or swallowing.  Blood in the stool.  Anemia.  How is this diagnosed? Gastric polyps are diagnosed with:  A medical procedure called endoscopy.  A lab test in which a part of the polyp is examined. This test is done with a sample of polyp tissue (biopsy) taken during an endoscopy.  How is this treated? Treatment depends on the type, location, and size of the polyps. Treatment may involve:  Having the polyps checked regularly with an endoscopy.  Having the polyps removed with an endoscopy. This may be done if the polyps are harmful or can become harmful. Removing a polyp often prevents problems from developing.  Having the polyps removed with a surgery called a partial gastrectomy. This may be done in rare cases to remove very large polyps.  Treating the underlying condition that caused the polyps.  Follow these instructions at home:  Take over-the-counter and  prescription medicines only as told by your health care provider.  Keep all follow-up visits as told by your health care provider. This is important. Contact a health care provider if:  You develop new symptoms.  Your symptoms get worse. Get help right away if:  You vomit blood.  You have severe abdominal pain.  You cannot eat or drink.  You have blood in your stool. This information is not intended to replace advice given to you by your health care provider. Make sure you discuss any questions you have with your health care provider. Document Released: 01/09/2012 Document Revised: 06/13/2015 Document Reviewed: 02/06/2015 Elsevier Interactive Patient Education  2018 [employer].  Esophagogastroduodenoscopy Esophagogastroduodenoscopy (EGD) is a procedure to examine the lining of the esophagus, stomach, and first part of the small intestine (duodenum). This procedure is done to check for problems such as inflammation, bleeding, ulcers, or growths. During this procedure, a long, flexible, lighted tube with a camera attached (endoscope) is inserted down the throat. Tell a health care provider about:  Any allergies you have.  All medicines you are taking, including vitamins, herbs, eye drops, creams, and over-the-counter medicines.  Any problems you or family members have had with anesthetic medicines.  Any blood disorders you have.  Any surgeries you have had.  Any medical conditions you have.  Whether you are pregnant or may be pregnant. What are the risks? Generally, this is a safe procedure. However, problems may occur, including:  Infection.  Bleeding.  A tear (perforation) in the esophagus, stomach, or duodenum.  Trouble breathing.  Excessive sweating.  Spasms of the larynx.  A slowed heartbeat.  Low blood pressure.  What happens before the procedure?  Follow instructions from your health care provider about eating or drinking restrictions.  Ask your  health care provider about: ? Changing or stopping your regular medicines. This is especially important if you are taking diabetes medicines or blood thinners. ? Taking medicines such as aspirin and ibuprofen. These medicines can thin your blood. Do not take these medicines before your procedure if your health care provider instructs you not to.  Plan to have someone take you home after the procedure.  If you wear dentures, be ready to remove them before the procedure. What happens during the procedure?  To reduce your risk of infection, your health care team will wash or sanitize their hands.  An IV tube will be put in a vein in your hand or arm. You will get medicines and fluids through this tube.  You will be given one or more of the following: ? A medicine to help you relax (sedative). ? A medicine to numb the area (local anesthetic). This medicine may be sprayed into your throat. It will make you feel more comfortable and keep you from gagging or coughing during the procedure. ? A medicine for pain.  A mouth guard may be placed in your mouth to protect your teeth and to keep you from biting on the endoscope.  You will be asked to lie on your left side.  The endoscope will be lowered down your throat into your esophagus, stomach, and duodenum.  Air will be put into the endoscope. This will help your health care provider see better.  The lining of your esophagus, stomach, and duodenum will be examined.  Your health care provider may: ? Take a tissue sample so it can be looked at in a lab (biopsy). ? Remove growths. ? Remove objects (foreign bodies) that are stuck. ? Treat any bleeding with medicines or other devices that stop tissue from bleeding. ? Widen (dilate) or stretch narrowed areas of your esophagus and stomach.  The endoscope will be taken out. The procedure may vary among health care providers and hospitals. What happens after the procedure?  Your blood pressure,  heart rate, breathing rate, and blood oxygen level will be monitored often until the medicines you were given have worn off.  Do not eat or drink anything until the numbing medicine has worn off and your gag reflex has returned. This information is not intended to replace advice given to you by your health care provider. Make sure you discuss any questions you have with your health care provider. Document Released: 05/25/2004 Document Revised: 06/30/2015 Document Reviewed: 12/16/2014 Elsevier Interactive Patient Education  2018 [employer]. No aspirin or NSAIDs for 10 days. Resume usual medications as before. Resume usual diet. No driving for 24 hours. Physician will call with biopsy results.

## 2017-10-24 NOTE — Op Note (Signed)
[facility] Patient Name: Seng Larch Procedure Date: 10/24/2017 9:13 AM MRN: [id number] Date of Birth: [birth year] Attending MD: Hildred Laser , MD CSN: [id number] Age: 57 Admit Type: Outpatient Procedure:                Upper GI endoscopy Indications:              Follow-up of gastric polyps Providers:                Hildred Laser, MD, Otis Peak B. Sharon Seller, RN, Randa Spike, Technician Referring MD:             Asencion Noble, MD Medicines:                Lidocaine spray, Meperidine 75 mg IV, Midazolam 10                            mg IV Complications:            No immediate complications. Estimated Blood Loss:     Estimated blood loss was minimal. Procedure:                Pre-Anesthesia Assessment:                           - Prior to the procedure, a History and Physical                            was performed, and patient medications and                            allergies were reviewed. The patient's tolerance of                            previous anesthesia was also reviewed. The risks                            and benefits of the procedure and the sedation                            options and risks were discussed with the patient.                            All questions were answered, and informed consent                            was obtained. Prior Anticoagulants: The patient has                            taken no previous anticoagulant or antiplatelet                            agents. ASA Grade Assessment: II - A patient with  mild systemic disease. After reviewing the risks                            and benefits, the patient was deemed in                            satisfactory condition to undergo the procedure.                           After obtaining informed consent, the endoscope was                            passed under direct vision. Throughout the                            procedure, the  patient's blood pressure, pulse, and                            oxygen saturations were monitored continuously. The                            GIF-H190 ([id number]) scope was introduced through the                            mouth, and advanced to the second part of duodenum.                            The upper GI endoscopy was accomplished without                            difficulty. The patient tolerated the procedure                            well. Scope In: 9:46:35 AM Scope Out: 10:06:29 AM Total Procedure Duration: 0 hours 19 minutes 54 seconds  Findings:      The examined esophagus was normal.      The Z-line was regular and was found 41 cm from the incisors.      Multiple 4 to 12 mm pedunculated and sessile polyps with no stigmata of       recent bleeding were found in the gastric fundus and in the gastric       body. Seven polyps were removed with a hot snare. The resected tissue       was retrieved using a Roth net. One hemostatic clip was successfully       placed (MR conditional).      The cardia, gastric antrum, prepyloric region of the stomach and pylorus       were normal.      The duodenal bulb and second portion of the duodenum were normal. Impression:               - Normal esophagus.                           - Z-line regular, 41 cm from the incisors.                           -  Multiple gastric polyps. Seven polyps snared.                            Resected tissue retrieved. Clip (MR conditional)                            was placed.                           - Normal cardia, antrum, prepyloric region of the                            stomach and pylorus.                           - Normal duodenal bulb and second portion of the                            duodenum. Moderate Sedation:      Moderate (conscious) sedation was administered by the endoscopy nurse       and supervised by the endoscopist. The following parameters were       monitored: oxygen saturation,  heart rate, blood pressure, CO2       capnography and response to care. Total physician intraservice time was       28 minutes. Recommendation:           - Patient has a contact number available for                            emergencies. The signs and symptoms of potential                            delayed complications were discussed with the                            patient. Return to normal activities tomorrow.                            Written discharge instructions were provided to the                            patient.                           - Resume previous diet today.                           - Resume usual medications.                           - No aspirin, ibuprofen, naproxen, or other                            non-steroidal anti-inflammatory drugs for 10 days  after polyp removal.                           - Await pathology results. Procedure Code(s):        --- Professional ---                           [phone], Esophagogastroduodenoscopy, flexible,                            transoral; with removal of tumor(s), polyp(s), or                            other lesion(s) by snare technique                           [id number], Moderate sedation services provided by the                            same physician or other qualified health care                            professional performing a gastrointestinal                            endoscopic service that sedation supports,                            requiring the presence of an independent trained                            observer to assist in the monitoring of the                            patient's level of consciousness and physiological                            status; initial 15 minutes of intra-service time;                            patient age 57 years or older (additional time may                            be reported with [phone], as appropriate)                           [phone],  Moderate sedation services provided by the                            same physician or other qualified health care                            professional performing the diagnostic or  therapeutic service that the sedation supports,                            requiring the presence of an independent trained                            observer to assist in the monitoring of the                            patient's level of consciousness and physiological                            status; each additional 15 minutes intraservice                            time (List separately in addition to code for                            primary service) Diagnosis Code(s):        --- Professional ---                           K31.7, Polyp of stomach and duodenum CPT copyright 2017 [employer]. All rights reserved. The codes documented in this report are preliminary and upon coder review may  be revised to meet current compliance requirements. Hildred Laser, MD Hildred Laser, MD 10/24/2017 10:29:21 AM This report has been signed electronically. Number of Addenda: 0

## 2017-10-24 NOTE — H&P (Signed)
Jacob Douglas is an 57 y.o. male.   Chief Complaint: Patient is here for EGD. HPI: Patient is 57 year old Caucasian male was chronic GERD and has been maintained on PPI.  He was found to have gastric polyps.  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  Patient was switched from pantoprazole to ranitidine but it did not control his heartburn.  He is back on pantoprazole 40 mg daily.  He is doing well.  He denies dysphagia nausea vomiting and his heartburn is well controlled with therapy.  He has intermittent bloating.  He denies abdominal pain or melena.  Past Medical History:  Diagnosis Date  . Anxiety   . GERD (gastroesophageal reflux disease)   . Hypertension   . Multiple gastric polyps   . Seasonal allergies   . Tubular adenoma     Past Surgical History:  Procedure Laterality Date  . BACK SURGERY  2002  . BALLOON DILATION N/A 04/05/2012   Procedure: BALLOON DILATION;  Surgeon: Daneil Dolin, MD;  Location: [facility];  Service: Gastroenterology;  Laterality: N/A;  . CHOLECYSTECTOMY N/A 03/31/2012   Procedure: LAPAROSCOPIC CHOLECYSTECTOMY;  Surgeon: Jamesetta So, MD;  Location: [facility];  Service: General;  Laterality: N/A;  . COLONOSCOPY  05/17/2011   Dr. Laural Golden- tubular adenoma at ileocecal valve  . COLONOSCOPY N/A 08/25/2014   Procedure: COLONOSCOPY;  Surgeon: Rogene Houston, MD;  [facility];  Service: Endoscopy;  Laterality: N/A;  930  . 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 ESOPHAGOGASTRODUODENOSCOPY N/A 07/23/2012   Procedure: ESOPHAGOGASTRODUODENOSCOPY (EGD);  Surgeon: Daneil Dolin, MD;  [facility];  Service: Endoscopy;  Laterality: N/A;  8:30  . ESOPHAGOGASTRODUODENOSCOPY N/A 08/25/2014   Procedure: ESOPHAGOGASTRODUODENOSCOPY (EGD);  Surgeon: Rogene Houston, MD;   [facility];  Service: Endoscopy;  Laterality: N/A;  . SPHINCTEROTOMY N/A 04/05/2012   Procedure: SPHINCTEROTOMY;  Surgeon: Daneil Dolin, MD;  Location: [facility];  Service: Gastroenterology;  Laterality: N/A;  . UMBILICAL HERNIA REPAIR N/A 03/31/2012   Procedure: HERNIA REPAIR UMBILICAL ADULT;  Surgeon: Jamesetta So, MD;  Location: [facility];  Service: General;  Laterality: N/A;    Family History  Problem Relation Age of Onset  . Hypertension Father   . Heart attack Other   . Colon cancer Neg Hx    Social History:  reports that he has never smoked. He has never used smokeless tobacco. He reports that he drinks about 4.0 standard drinks of alcohol per week. He reports that he does not use drugs.  Allergies:  Allergies  Allergen Reactions  . Penicillins Rash    Has patient had a PCN reaction causing immediate rash, facial/tongue/throat swelling, SOB or lightheadedness with hypotension: Unknown Has patient had a PCN reaction causing severe rash involving mucus membranes or skin necrosis: Unknown Has patient had a PCN reaction that required hospitalization: No Has patient had a PCN reaction occurring within the last 10 years: No If all of the above answers are "NO", then may proceed with Cephalosporin use.     Medications Prior to Admission  Medication Sig Dispense Refill  . ALPRAZolam (XANAX) 0.5 MG tablet Take 0.5 mg by mouth daily as needed for anxiety.     Marland Kitchen amLODipine-benazepril (LOTREL) 10-40 MG capsule Take 1 capsule by mouth daily.     . Melatonin 10  MG TABS Take 10 mg by mouth at bedtime.    . pantoprazole (PROTONIX) 40 MG tablet Take 40 mg by mouth daily.    . diphenhydramine-acetaminophen (TYLENOL PM) 25-500 MG TABS Take 1 tablet by mouth at bedtime as needed (for sleep).       No results found for this or any previous visit (from the past 48 hour(s)). No results found.  ROS  Blood pressure (!) 127/93, pulse (!) 55, temperature 97.7 F (36.5 C), temperature  source Oral, resp. rate 10, height 5\' 9"  (1.753 m), weight 83.9 kg, SpO2 97 %. Physical Exam  Constitutional: He appears well-developed and well-nourished.  HENT:  Mouth/Throat: Oropharynx is clear and moist.  Eyes: Conjunctivae are normal. No scleral icterus.  Neck: No thyromegaly present.  Cardiovascular: Normal rate, regular rhythm and normal heart sounds.  No murmur heard. Respiratory: Effort normal and breath sounds normal.  GI: Soft. He exhibits no distension and no mass. There is no tenderness.  Lymphadenopathy:    He has no cervical adenopathy.     Assessment/Plan Follow-up for gastric polyps. EGD with possible gastric polypectomy.  Hildred Laser, MD 10/24/2017, 9:34 AM

## 2017-10-28 ENCOUNTER — Encounter (HOSPITAL_COMMUNITY): Payer: Self-pay | Admitting: Internal Medicine

## 2018-03-05 ENCOUNTER — Other Ambulatory Visit (HOSPITAL_COMMUNITY): Payer: Self-pay | Admitting: Internal Medicine

## 2018-03-05 ENCOUNTER — Ambulatory Visit (HOSPITAL_COMMUNITY)
Admission: RE | Admit: 2018-03-05 | Discharge: 2018-03-05 | Disposition: A | Discharge status: Home / Self Care | Payer: 59 | Source: Ambulatory Visit | Attending: Internal Medicine | Admitting: Internal Medicine

## 2018-03-05 DIAGNOSIS — M25561 Pain in right knee: Secondary | ICD-10-CM | POA: Insufficient documentation

## 2018-03-26 ENCOUNTER — Other Ambulatory Visit: Payer: Self-pay | Admitting: Student

## 2018-03-26 DIAGNOSIS — M5442 Lumbago with sciatica, left side: Principal | ICD-10-CM

## 2018-03-26 DIAGNOSIS — G8929 Other chronic pain: Secondary | ICD-10-CM

## 2018-04-15 ENCOUNTER — Ambulatory Visit
Admission: RE | Admit: 2018-04-15 | Discharge: 2018-04-15 | Disposition: A | Discharge status: Home / Self Care | Payer: 59 | Source: Ambulatory Visit | Attending: Student | Admitting: Student

## 2018-04-15 DIAGNOSIS — G8929 Other chronic pain: Secondary | ICD-10-CM

## 2018-04-15 DIAGNOSIS — M5442 Lumbago with sciatica, left side: Principal | ICD-10-CM

## 2018-04-15 MED ORDER — GADOBENATE DIMEGLUMINE 529 MG/ML IV SOLN
16.0000 mL | Freq: Once | INTRAVENOUS | Status: AC | PRN
  Administered 2018-04-15: 16 mL via INTRAVENOUS

## 2018-07-10 ENCOUNTER — Encounter: Payer: Self-pay | Admitting: *Deleted

## 2018-07-10 ENCOUNTER — Encounter: Payer: Self-pay | Admitting: Cardiology

## 2018-07-10 ENCOUNTER — Telehealth: Payer: Self-pay | Admitting: Cardiology

## 2018-07-10 ENCOUNTER — Ambulatory Visit (INDEPENDENT_AMBULATORY_CARE_PROVIDER_SITE_OTHER): Payer: 59 | Admitting: Cardiology

## 2018-07-10 VITALS — BP 136/83 | HR 71 | Ht 69.0 in | Wt 191.2 lb

## 2018-07-10 DIAGNOSIS — R0609 Other forms of dyspnea: Secondary | ICD-10-CM | POA: Diagnosis not present

## 2018-07-10 DIAGNOSIS — I10 Essential (primary) hypertension: Secondary | ICD-10-CM | POA: Diagnosis not present

## 2018-07-10 DIAGNOSIS — K219 Gastro-esophageal reflux disease without esophagitis: Secondary | ICD-10-CM

## 2018-07-10 DIAGNOSIS — R072 Precordial pain: Secondary | ICD-10-CM

## 2018-07-10 NOTE — Telephone Encounter (Signed)
Pre-cert Verification for the following procedure   Exercise myoview set for 07-15-2018 at [facility]

## 2018-07-10 NOTE — Progress Notes (Signed)
Cardiology Office Note  Date: 07/10/2018   ID: Jacob Douglas, DOB May 10, 1960, MRN [id number]  PCP:  Asencion Noble, MD  Consulting Cardiologist:  Rozann Lesches, MD Electrophysiologist:  None   Chief Complaint  Patient presents with  . Chest Pain    History of Present Illness: Jacob Douglas is a 58 y.o. male referred for cardiology consultation by Dr. Willey Blade for the evaluation of chest pain.  I reviewed his records.  He describes an episode of exertional chest tightness and shortness of breath that occurred on January 19, 2018.  He was at [facility] [place] for the graduation of his daughter, had walked about 1/2 mile from his car, had to go back for something, when walking up an incline he developed his symptoms.  Since that time however he has not reported any similar symptomatology.  He works as a Investment banker, corporate at [employer], does walk at his job, reports NYHA class II dyspnea at baseline.  I reviewed his medications which are outlined below.  He reports compliance and no obvious intolerances.  He has not undergone any prior ischemic testing.  I personally reviewed his ECG today which shows normal sinus rhythm.  Past Medical History:  Diagnosis Date  . Anxiety   . Essential hypertension   . GERD (gastroesophageal reflux disease)   . Hepatic steatosis   . Lumbar disc disease   . Multiple gastric polyps   . Seasonal allergies   . Tubular adenoma     Past Surgical History:  Procedure Laterality Date  . BACK SURGERY  2002  . BALLOON DILATION N/A 04/05/2012   Procedure: BALLOON DILATION;  Surgeon: Daneil Dolin, MD;  Location: [facility];  Service: Gastroenterology;  Laterality: N/A;  . CHOLECYSTECTOMY N/A 03/31/2012   Procedure: LAPAROSCOPIC CHOLECYSTECTOMY;  Surgeon: Jamesetta So, MD;  Location: [facility];  Service: General;  Laterality: N/A;  . COLONOSCOPY  05/17/2011   Dr. Laural Golden- tubular adenoma at ileocecal valve  . COLONOSCOPY N/A 08/25/2014   Procedure: COLONOSCOPY;   Surgeon: Rogene Houston, MD;  [facility];  Service: Endoscopy;  Laterality: N/A;  930  . 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 ESOPHAGOGASTRODUODENOSCOPY N/A 07/23/2012   Procedure: ESOPHAGOGASTRODUODENOSCOPY (EGD);  Surgeon: Daneil Dolin, MD;  [facility];  Service: Endoscopy;  Laterality: N/A;  8:30  . ESOPHAGOGASTRODUODENOSCOPY N/A 08/25/2014   Procedure: ESOPHAGOGASTRODUODENOSCOPY (EGD);  Surgeon: Rogene Houston, MD;  [facility];  Service: Endoscopy;  Laterality: N/A;  . ESOPHAGOGASTRODUODENOSCOPY N/A 10/24/2017   Procedure: ESOPHAGOGASTRODUODENOSCOPY (EGD);  Surgeon: Rogene Houston, MD;  [facility];  Service: Endoscopy;  Laterality: N/A;  830  . POLYPECTOMY  10/24/2017   Procedure: POLYPECTOMY;  Surgeon: Rogene Houston, MD;  [facility];  Service: Endoscopy;;  gastric  . SPHINCTEROTOMY N/A 04/05/2012   Procedure: SPHINCTEROTOMY;  Surgeon: Daneil Dolin, MD;  Location: [facility];  Service: Gastroenterology;  Laterality: N/A;  . UMBILICAL HERNIA REPAIR N/A 03/31/2012   Procedure: HERNIA REPAIR UMBILICAL ADULT;  Surgeon: Jamesetta So, MD;  Location: [facility];  Service: General;  Laterality: N/A;    Current Outpatient Medications  Medication Sig Dispense Refill  . ALPRAZolam (XANAX) 0.5 MG tablet Take 0.5 mg by mouth daily as needed for anxiety.     Marland Kitchen amLODipine-benazepril (LOTREL) 10-40  MG capsule Take 1 capsule by mouth daily.     . diphenhydramine-acetaminophen (TYLENOL PM) 25-500 MG TABS Take 1 tablet by mouth at bedtime as needed (for sleep).     . Melatonin 10 MG TABS Take 10 mg by mouth at bedtime as needed.     . pantoprazole (PROTONIX) 40 MG tablet Take 40 mg by mouth daily.     No current facility-administered medications for this visit.     Allergies:  Penicillins   Social History: The patient  reports that he has never smoked. He has never used smokeless tobacco. He reports current alcohol use of about 4.0 standard drinks of alcohol per week. He reports that he does not use drugs.   Family History: The patient's family history includes Diabetes Mellitus II in his mother; Heart attack in an other family member; Hypertension in his mother; Leukemia in his father.   ROS:  Please see the history of present illness. Otherwise, complete review of systems is positive for intermittent knee pain.  All other systems are reviewed and negative.   Physical Exam: VS:  BP 136/83   Pulse 71   Ht 5\' 9"  (1.753 m)   Wt 191 lb 3.2 oz (86.7 kg)   SpO2 95%   BMI 28.24 kg/m , BMI Body mass index is 28.24 kg/m.  Wt Readings from Last 3 Encounters:  07/10/18 191 lb 3.2 oz (86.7 kg)  10/24/17 185 lb (83.9 kg)  08/25/14 176 lb (79.8 kg)    General: Patient appears comfortable at rest. HEENT: Conjunctiva and lids normal, oropharynx clear. Neck: Supple, no elevated JVP or carotid bruits, no thyromegaly. Lungs: Clear to auscultation, nonlabored breathing at rest. Cardiac: Regular rate and rhythm, no S3 or significant systolic murmur, no pericardial rub. Abdomen: Soft, nontender, bowel sounds present. Extremities: No pitting edema, distal pulses 2+. Skin: Warm and dry. Musculoskeletal: No kyphosis. Neuropsychiatric: Alert and oriented x3, affect grossly appropriate.  ECG:  An ECG dated 04/04/2012 was personally reviewed today and demonstrated:  Sinus tachycardia with leftward axis.  Recent Labwork:  Recent lab work per Dr. Willey Blade: Cholesterol 211, HDL 62, LDL 115, triglycerides 171  Assessment and Plan:  1.  Episode of exertional chest tightness with shortness of breath as outlined above in a 58 year old male with history of hypertension, LDL 115, overweight. ECG is normal today.  He has not undergone any prior objective ischemic testing  and we will arrange an exercise Myoview.  2.  Essential hypertension, on Lotrel.  No changes made today.  Keep follow-up with Dr. Willey Blade.  3.  GERD on Protonix.  Medication Adjustments/Labs and Tests Ordered: Current medicines are reviewed at length with the patient today.  Concerns regarding medicines are outlined above.   Tests Ordered: Orders Placed This Encounter  Procedures  . NM Myocar Multi W/Spect W/Wall Motion / EF  . EKG 12-Lead    Medication Changes: No orders of the defined types were placed in this encounter.   Disposition:  Follow up test results.  Signed, Satira Sark, MD, [facility] 07/10/2018 9:28 AM    [facility] [place], [place], [place] [address] Phone: [phone]; Fax: [phone]

## 2018-07-10 NOTE — Patient Instructions (Signed)
Medication Instructions:  °Continue all current medications. ° °Labwork: °none ° °Testing/Procedures: °Your physician has requested that you have an exercise stress myoview. For further information please visit [email or web address]. Please follow instruction sheet, as given.  °Office will contact with results via phone or letter.    ° °Follow-Up: °Pending test results  ° °Any Other Special Instructions Will Be Listed Below (If Applicable). ° ° °If you need a refill on your cardiac medications before your next appointment, please call your pharmacy. ° °

## 2018-07-11 ENCOUNTER — Ambulatory Visit: Payer: 59 | Admitting: Cardiology

## 2018-07-15 ENCOUNTER — Other Ambulatory Visit: Payer: Self-pay

## 2018-07-15 ENCOUNTER — Encounter (HOSPITAL_COMMUNITY)
Admission: RE | Admit: 2018-07-15 | Discharge: 2018-07-15 | Disposition: A | Discharge status: Home / Self Care | Payer: 59 | Source: Ambulatory Visit | Attending: Cardiology | Admitting: Cardiology

## 2018-07-15 ENCOUNTER — Encounter (HOSPITAL_BASED_OUTPATIENT_CLINIC_OR_DEPARTMENT_OTHER)
Admission: RE | Admit: 2018-07-15 | Discharge: 2018-07-15 | Disposition: A | Discharge status: Home / Self Care | Payer: 59 | Source: Ambulatory Visit | Attending: Cardiology | Admitting: Cardiology

## 2018-07-15 DIAGNOSIS — R072 Precordial pain: Secondary | ICD-10-CM | POA: Insufficient documentation

## 2018-07-15 LAB — NM MYOCAR MULTI W/SPECT W/WALL MOTION / EF
LV dias vol: 80 mL (ref 62–150)
LV sys vol: 31 mL
Peak HR: 92 {beats}/min
RATE: 0.35
Rest HR: 62 {beats}/min
SDS: 0
SRS: 0
SSS: 0
TID: 1.06

## 2018-07-15 MED ORDER — TECHNETIUM TC 99M TETROFOSMIN IV KIT
10.0000 | PACK | Freq: Once | INTRAVENOUS | Status: AC | PRN
  Administered 2018-07-15: 9.6 via INTRAVENOUS

## 2018-07-15 MED ORDER — SODIUM CHLORIDE 0.9% FLUSH
INTRAVENOUS | Status: AC
Start: 2018-07-15 — End: 2018-07-15
  Administered 2018-07-15: 10 mL via INTRAVENOUS
  Filled 2018-07-15: qty 10

## 2018-07-15 MED ORDER — TECHNETIUM TC 99M TETROFOSMIN IV KIT
30.0000 | PACK | Freq: Once | INTRAVENOUS | Status: AC | PRN
  Administered 2018-07-15: 10:00:00 31 via INTRAVENOUS

## 2018-07-15 MED ORDER — REGADENOSON 0.4 MG/5ML IV SOLN
INTRAVENOUS | Status: AC
  Administered 2018-07-15: 0.4 mg via INTRAVENOUS
  Filled 2018-07-15: qty 5

## 2018-07-16 ENCOUNTER — Telehealth: Payer: Self-pay | Admitting: *Deleted

## 2018-07-16 NOTE — Telephone Encounter (Signed)
Patient informed. Copy sent to PCP °

## 2018-07-16 NOTE — Telephone Encounter (Signed)
-----   Message from Satira Sark, MD sent at 07/15/2018  3:14 PM EDT ----- Results reviewed.  Please let him know that the stress test looked good overall, low risk for major obstructive CAD as cause of symptoms and normal LVEF.  Unless he has progressive shortness of breath or exertional chest pain, no further cardiac testing is planned at this time.  Keep follow-up with Dr. Willey Blade.

## 2018-11-05 ENCOUNTER — Ambulatory Visit (INDEPENDENT_AMBULATORY_CARE_PROVIDER_SITE_OTHER): Payer: 59 | Admitting: Nurse Practitioner

## 2018-11-06 ENCOUNTER — Encounter (INDEPENDENT_AMBULATORY_CARE_PROVIDER_SITE_OTHER): Payer: Self-pay | Admitting: Nurse Practitioner

## 2018-11-06 ENCOUNTER — Other Ambulatory Visit: Payer: Self-pay

## 2018-11-06 ENCOUNTER — Ambulatory Visit (INDEPENDENT_AMBULATORY_CARE_PROVIDER_SITE_OTHER): Payer: 59 | Admitting: Nurse Practitioner

## 2018-11-06 VITALS — BP 129/85 | HR 76 | Temp 98.2°F | Ht 68.0 in | Wt 192.7 lb

## 2018-11-06 DIAGNOSIS — K219 Gastro-esophageal reflux disease without esophagitis: Secondary | ICD-10-CM | POA: Insufficient documentation

## 2018-11-06 DIAGNOSIS — K317 Polyp of stomach and duodenum: Secondary | ICD-10-CM | POA: Diagnosis not present

## 2018-11-06 NOTE — Progress Notes (Signed)
   Subjective:    Patient ID: Jacob Douglas, male    DOB: 10/22/1960, 58 y.o.   MRN: [id number]  HPI Jacob Douglas is a 58 year old male with a past medical history of GERD and numerous gastric fundic gland polyps. He presents today for his annual review. He is taking Pantoprazole 40mg  once daily for at least 15 years. He takes Gas X 1 tab bid for burping.  I discussed long term PPI treatment is associated with fundic gland formation. He tried Pepcid or Zantac for 1 month in 2016 and his reflux was poorly controlled so he went back on Pantoprazole and his reflux symptoms resolved. He wishes to continue taking Pantoprazole. He denies having any dysphagia, heartburn or stomach pain. He is passing a normal solid stool once daily. No rectal bleeding or black stool. He takes an occasional Advil PM, no other NSAIDS. His last colonoscopy was 08/25/2014. He was advised by Dr. Laural Golden to repeat a colonoscopy in 5 years.   EGD 10/24/2017:  -Normal esophagus - Z-line regular, 41 cm from the incisors. - Multiple gastric polyps. Seven polyps snared. Resected tissue retrieved. Clip (MR   conditional) was placed. Biopsies showed fundic gland polyps.  - Normal cardia, antrum, prepyloric region of the stomach and pylorus. - Normal duodenal bulb and second portion of the duodenum.  EGD 05/26/2014:  No evidence of erosive esophagitis. Multiple polyps ranging in size from 5-10 mm involving proximal stomach. Three polyps were hot snared and two were retrieved. Single 360 clip applied to one polypectomy site for hemostasis.  Colonoscopy 05/26/2014:  Examination performed to cecum. Focal mucosal irregularity at ileocecal valve possibly prolapse small bowel mucosa. Biopsy taken. No evidence of recurrent polyps. Internal hemorrhoids Biopsies: - BENIGN COLONIC MUCOSA WITH BENIGN LYMPHOID AGGREGATES. - NO SIGNIFICANT INFLAMMATORY CHANGES, ADENOMATOUS CHANGE OR MALIGNANCY.  Review of Systems See HPI, all other systems  reviewed and are negative      Objective:   Physical Exam  BP 129/85   Pulse 76   Temp 98.2 F (36.8 C) (Oral)   Ht 5\' 8"  (1.727 m)   Wt 192 lb 11.2 oz (87.4 kg)   BMI 29.30 kg/m  General: 58 year old male well-developed in no acute distress Eyes: Sclera nonicteric, conjunctiva pink Heart: Regular rate and rhythm, no murmurs Lungs: Breath sounds clear throughout Abdomen: Soft, nontender, no masses or organomegaly, positive bowel sounds all 4 quadrants Extremities: No edema Neuro: Alert and oriented x4, no focal deficits     Assessment & Plan:   73.  58 year old male with GERD symptoms well controlled on Pantoprazole 40 mg once daily  2. History of numerous gastric fundic gland polyps most likely due to chronic PPI use -Patient wishes to continue Pantoprazole 40 mg once daily, I discussed potentially reducing the dose of Pantoprazole to 20 mg once daily in the near future.  He will discuss this further with Dr. Dorien Chihuahua at the time of his next office visit. -Follow-up in the office in 6 months and as needed  3.  History of a tubular adenomatous polyp at the IC valve 05/17/2011.  Colonoscopy 05/26/2014 without polyps. -Next colonoscopy due July 2021 as previously verified by Dr. Dorien Chihuahua

## 2018-11-06 NOTE — Patient Instructions (Signed)
1. Continue Pantoprazole 40mg  one capsule to be taken 30 minutes before breakfast.  2. Follow up in office in 6 months   3. Your next colonoscopy is due July 2021.

## 2019-01-14 ENCOUNTER — Telehealth (INDEPENDENT_AMBULATORY_CARE_PROVIDER_SITE_OTHER): Payer: Self-pay | Admitting: Internal Medicine

## 2019-01-14 NOTE — Telephone Encounter (Signed)
Patient called regarding his appointment in May of 2021 - patient didn't understand the need for the appointment - patient was told his next colonoscopy was due in July - stated Dr Laural Golden would always just send a letter and it would be scheduled - please check with Dr Laural Golden about the appointment in May to see if patient needs to keep it -

## 2019-01-15 NOTE — Telephone Encounter (Signed)
This was the recommendation at his last office visit. If he wants to cancel that will be his decision. Please call patient.

## 2019-01-23 ENCOUNTER — Ambulatory Visit: Payer: 59 | Attending: Internal Medicine

## 2019-01-23 DIAGNOSIS — Z20822 Contact with and (suspected) exposure to covid-19: Secondary | ICD-10-CM

## 2019-01-24 LAB — NOVEL CORONAVIRUS, NAA: SARS-CoV-2, NAA: NOT DETECTED

## 2019-05-07 ENCOUNTER — Ambulatory Visit (INDEPENDENT_AMBULATORY_CARE_PROVIDER_SITE_OTHER): Payer: 59 | Admitting: Nurse Practitioner

## 2019-06-22 ENCOUNTER — Other Ambulatory Visit: Payer: Self-pay

## 2019-06-22 ENCOUNTER — Ambulatory Visit (INDEPENDENT_AMBULATORY_CARE_PROVIDER_SITE_OTHER): Payer: 59 | Admitting: Internal Medicine

## 2019-06-22 ENCOUNTER — Encounter (INDEPENDENT_AMBULATORY_CARE_PROVIDER_SITE_OTHER): Payer: Self-pay | Admitting: Internal Medicine

## 2019-06-22 VITALS — BP 121/79 | HR 74 | Temp 97.2°F | Ht 69.0 in | Wt 190.9 lb

## 2019-06-22 DIAGNOSIS — K317 Polyp of stomach and duodenum: Secondary | ICD-10-CM

## 2019-06-22 DIAGNOSIS — Z86010 Personal history of colonic polyps: Secondary | ICD-10-CM | POA: Insufficient documentation

## 2019-06-22 DIAGNOSIS — K219 Gastro-esophageal reflux disease without esophagitis: Secondary | ICD-10-CM | POA: Diagnosis not present

## 2019-06-22 NOTE — Progress Notes (Signed)
Presenting complaint;  Follow-up for GERD gastric polyps and history of colonic adenomas.  Database and subjective:  Patient is 59 year old Caucasian male with history of colonic adenomas chronic GERD and gastric polyps present for scheduled visit. Patient had tubular adenoma removed piecemeal from ileocecal valve in April 2013 and a follow-up exam in April 2016 was negative.  He also has history of gastric polyps.  He had 7 polyps removed on his last EGD of September 2019 and these were fundic gland polyps without atypia or dysplasia.  On his last visit of October 2020 Ms. Carl Best NP recommended dropping dose to 20 mg with it was reluctant.  He states he is doing well.  He rarely has heartburn when he eats [place] or spicy food.  He does noted flatulence and using Gas-X on as-needed basis.  He denies nausea vomiting dysphagia or throat symptoms.  He also denies abdominal pain.  Bowels move daily.  He denies melena or rectal bleeding.  His appetite is good and his weight has been stable. He received J&J vaccine without untoward side effects.  Family history is negative for CRC.  Current Medications: Outpatient Encounter Medications as of 06/22/2019  Medication Sig  . ALPRAZolam (XANAX) 0.5 MG tablet Take 0.5 mg by mouth daily as needed for anxiety.   Marland Kitchen amLODipine-benazepril (LOTREL) 10-40 MG capsule Take 1 capsule by mouth daily.   . diphenhydramine-acetaminophen (TYLENOL PM) 25-500 MG TABS Take 1 tablet by mouth at bedtime as needed (for sleep).   . Melatonin 10 MG TABS Take 10 mg by mouth at bedtime as needed.   . pantoprazole (PROTONIX) 40 MG tablet Take 40 mg by mouth daily.  Marland Kitchen VITAMIN D PO Take by mouth daily.   No facility-administered encounter medications on file as of 06/22/2019.    Objective:  Blood pressure 121/79, pulse 74, temperature (!) 97.2 F (36.2 C), temperature source Temporal, height 5\' 9"  (1.753 m), weight 190 lb 14.4 oz (86.6 kg). Patient is alert and  in no acute distress. He is wearing facial mask. Conjunctiva is pink. Sclera is nonicteric Oropharyngeal mucosa is normal. No neck masses or thyromegaly noted. Cardiac exam with regular rhythm normal S1 and S2. No murmur or gallop noted. Lungs are clear to auscultation. Abdomen: Soft and nontender with no organomegaly or masses. No LE edema or clubbing noted.  Assessment:  #1.  Chronic GERD.  Patient is doing well with lifestyle modifications and 40 mg of pantoprazole daily.  He does not have Barrett's esophagus.  Since he is doing well I would like for him to drop pantoprazole dose to maybe 5 or 6 doses per week to begin with.  #2.  History of multiple fundic gland gastric polyps his last EGD was in September 2019 and multiple polyps were removed.  There was no dysplasia.  These polyps are most likely related to chronic acid suppression but they can be seen without PPI use.  There are no current guidelines asked to follow-up.  I would consider repeat EGD in September 2024 or 5 years from his last exam unless he develops symptoms such as pain or bleeding in which case we will proceed with exam earlier.  I would consider exam under monitored anesthesia care and take as many polyps out as possible.  #3.  History of colonic adenomas.  He had a tubular adenoma removed piecemeal from ileocecal valve.  Follow-up exam in April 2016 was negative.  He will undergo surveillance colonoscopy in July 2021.  Plan:  Patient  advised to change pantoprazole dose as follows.  He will skip dose every fourth day and see how he does.  If he continues to do well then I would ask him to skip dose every third day. Surveillance colonoscopy in July 2021. Office visit in 1 year.

## 2019-06-22 NOTE — Patient Instructions (Addendum)
Colonoscopy to be scheduled in July 2021. Try decreasing pantoprazole to 6 doses per week.  If you do not experience breakthrough symptoms then you could drop it to 5 doses per week.  If it does not work continue pantoprazole daily at current dose.

## 2019-07-10 ENCOUNTER — Encounter (INDEPENDENT_AMBULATORY_CARE_PROVIDER_SITE_OTHER): Payer: Self-pay | Admitting: *Deleted

## 2019-07-10 ENCOUNTER — Other Ambulatory Visit (INDEPENDENT_AMBULATORY_CARE_PROVIDER_SITE_OTHER): Payer: Self-pay | Admitting: *Deleted

## 2019-07-10 ENCOUNTER — Telehealth (INDEPENDENT_AMBULATORY_CARE_PROVIDER_SITE_OTHER): Payer: Self-pay | Admitting: *Deleted

## 2019-07-10 MED ORDER — SUTAB 1479-225-188 MG PO TABS: 1.0000 | ORAL_TABLET | Freq: Once | ORAL | 0 refills | Status: AC

## 2019-07-10 NOTE — Telephone Encounter (Signed)
Patient needs S[place] (copay card) ° °

## 2019-07-14 ENCOUNTER — Other Ambulatory Visit (INDEPENDENT_AMBULATORY_CARE_PROVIDER_SITE_OTHER): Payer: Self-pay | Admitting: *Deleted

## 2019-07-14 DIAGNOSIS — Z86010 Personal history of colonic polyps: Secondary | ICD-10-CM

## 2019-07-30 ENCOUNTER — Encounter (INDEPENDENT_AMBULATORY_CARE_PROVIDER_SITE_OTHER): Payer: Self-pay | Admitting: *Deleted

## 2019-08-17 ENCOUNTER — Other Ambulatory Visit: Payer: Self-pay

## 2019-08-17 ENCOUNTER — Other Ambulatory Visit (HOSPITAL_COMMUNITY)
Admission: RE | Admit: 2019-08-17 | Discharge: 2019-08-17 | Disposition: A | Discharge status: Home / Self Care | Payer: 59 | Source: Ambulatory Visit | Attending: Internal Medicine | Admitting: Internal Medicine

## 2019-08-17 DIAGNOSIS — Z20822 Contact with and (suspected) exposure to covid-19: Secondary | ICD-10-CM | POA: Insufficient documentation

## 2019-08-17 DIAGNOSIS — Z01812 Encounter for preprocedural laboratory examination: Secondary | ICD-10-CM | POA: Diagnosis not present

## 2019-08-18 LAB — SARS CORONAVIRUS 2 (TAT 6-24 HRS): SARS Coronavirus 2: NEGATIVE

## 2019-08-19 ENCOUNTER — Ambulatory Visit (HOSPITAL_COMMUNITY)
Admission: RE | Admit: 2019-08-19 | Discharge: 2019-08-19 | Disposition: A | Discharge status: Home / Self Care | Payer: 59 | Attending: Internal Medicine | Admitting: Internal Medicine

## 2019-08-19 ENCOUNTER — Other Ambulatory Visit: Payer: Self-pay

## 2019-08-19 ENCOUNTER — Encounter (HOSPITAL_COMMUNITY)
Admission: RE | Disposition: A | Discharge status: Home / Self Care | Payer: Self-pay | Source: Home / Self Care | Attending: Internal Medicine

## 2019-08-19 DIAGNOSIS — Z86010 Personal history of colonic polyps: Secondary | ICD-10-CM

## 2019-08-19 DIAGNOSIS — Q439 Congenital malformation of intestine, unspecified: Secondary | ICD-10-CM | POA: Diagnosis not present

## 2019-08-19 DIAGNOSIS — Z1211 Encounter for screening for malignant neoplasm of colon: Secondary | ICD-10-CM | POA: Diagnosis present

## 2019-08-19 DIAGNOSIS — K648 Other hemorrhoids: Secondary | ICD-10-CM | POA: Insufficient documentation

## 2019-08-19 DIAGNOSIS — D123 Benign neoplasm of transverse colon: Secondary | ICD-10-CM | POA: Diagnosis not present

## 2019-08-19 HISTORY — PX: POLYPECTOMY: SHX5525

## 2019-08-19 HISTORY — PX: COLONOSCOPY: SHX5424

## 2019-08-19 SURGERY — COLONOSCOPY
Anesthesia: Moderate Sedation

## 2019-08-19 MED ORDER — MEPERIDINE HCL 50 MG/ML IJ SOLN
INTRAMUSCULAR | Status: AC
  Filled 2019-08-19: qty 1

## 2019-08-19 MED ORDER — SODIUM CHLORIDE 0.9 % IV SOLN: INTRAVENOUS | Status: DC

## 2019-08-19 MED ORDER — MEPERIDINE HCL 50 MG/ML IJ SOLN
INTRAMUSCULAR | Status: DC | PRN
  Administered 2019-08-19 (×3): 25 mg

## 2019-08-19 MED ORDER — MIDAZOLAM HCL 5 MG/5ML IJ SOLN
INTRAMUSCULAR | Status: AC
  Filled 2019-08-19: qty 10

## 2019-08-19 MED ORDER — MIDAZOLAM HCL 5 MG/5ML IJ SOLN
INTRAMUSCULAR | Status: DC | PRN
  Administered 2019-08-19 (×5): 2 mg via INTRAVENOUS

## 2019-08-19 NOTE — H&P (Signed)
Jacob Douglas is an 59 y.o. Douglas.   Chief Complaint: Jacob Douglas is here for colonoscopy. HPI: Jacob Douglas was history of colonic adenomas and is here for surveillance colonoscopy.  He denies abdominal pain change in bowel habits or rectal bleeding.  Jacob Douglas had tubular adenoma removed from ileocecal valve in January 2013.  He return for repeat exam in April 2016.  There was no recurrence of this polyp or any new polyps. He takes BC or Advil no more than three or four times a week.   Family history is negative for CRC.  Past Medical History:  Diagnosis Date  . Anxiety   . Essential hypertension   . GERD (gastroesophageal reflux disease)   . Hepatic steatosis   . Lumbar disc disease   . Multiple gastric polyps   . Seasonal allergies   .  History of colonic adenomas.     Past Surgical History:  Procedure Laterality Date  . BACK SURGERY  2002  . BALLOON DILATION N/A 04/05/2012   Procedure: BALLOON DILATION;  Surgeon: Daneil Dolin, MD;  Location: [facility];  Service: Gastroenterology;  Laterality: N/A;  . CHOLECYSTECTOMY N/A 03/31/2012   Procedure: LAPAROSCOPIC CHOLECYSTECTOMY;  Surgeon: Jamesetta So, MD;  Location: [facility];  Service: General;  Laterality: N/A;  . COLONOSCOPY  05/17/2011   Dr. Laural Golden- tubular adenoma at ileocecal valve  . COLONOSCOPY N/A 08/25/2014   Procedure: COLONOSCOPY;  Surgeon: Rogene Houston, MD;  Location: [facility];  Service: Endoscopy;  Laterality: N/A;  930  . 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 ESOPHAGOGASTRODUODENOSCOPY N/A 07/23/2012   Procedure: ESOPHAGOGASTRODUODENOSCOPY (EGD);  Surgeon: Daneil Dolin, MD;  Location: [facility];  Service: Endoscopy;  Laterality: N/A;  8:30  . ESOPHAGOGASTRODUODENOSCOPY N/A 08/25/2014   Procedure:  ESOPHAGOGASTRODUODENOSCOPY (EGD);  Surgeon: Rogene Houston, MD;  Location: [facility];  Service: Endoscopy;  Laterality: N/A;  . ESOPHAGOGASTRODUODENOSCOPY N/A 10/24/2017   Procedure: ESOPHAGOGASTRODUODENOSCOPY (EGD);  Surgeon: Rogene Houston, MD;  [facility]: [facility];  Service: Endoscopy;  Laterality: N/A;  830  . POLYPECTOMY  10/24/2017   Procedure: POLYPECTOMY;  Surgeon: Rogene Houston, MD;  Location: [facility];  Service: Endoscopy;;  gastric  . SPHINCTEROTOMY N/A 04/05/2012   Procedure: SPHINCTEROTOMY;  Surgeon: Daneil Dolin, MD;  Location: [facility];  Service: Gastroenterology;  Laterality: N/A;  . UMBILICAL HERNIA REPAIR N/A 03/31/2012   Procedure: HERNIA REPAIR UMBILICAL ADULT;  Surgeon: Jamesetta So, MD;  Location: [facility];  Service: General;  Laterality: N/A;    Family History  Problem Relation Age of Onset  . Leukemia Father   . Heart attack Other   . Diabetes Mellitus II Mother   . Hypertension Mother   . Colon cancer Neg Hx    Social History:  reports that he has never smoked. He has never used smokeless tobacco. He reports current alcohol use of about 4.0 standard drinks of alcohol per week. He reports that he does not use drugs.  Allergies:  Allergies  Allergen Reactions  . Penicillins Rash    Has Jacob Douglas had a PCN reaction causing immediate rash, facial/tongue/throat swelling, SOB or lightheadedness with hypotension: Unknown Has Jacob Douglas had a PCN reaction causing severe rash involving mucus membranes or skin necrosis: Unknown Has Jacob Douglas had a PCN reaction that required hospitalization: No Has  Jacob Douglas had a PCN reaction occurring within the last 10 years: No If all of the above answers are "NO", then may proceed with Cephalosporin use.     Medications Prior to Admission  Medication Sig Dispense Refill  . ALPRAZolam (XANAX) 0.5 MG tablet Take 0.5 mg by mouth daily as needed for anxiety.     Marland Kitchen amLODipine-benazepril (LOTREL) 10-40 MG capsule Take 1 capsule  by mouth daily.     . Aspirin-Caffeine (BC FAST PAIN RELIEF PO) Take 1 packet by mouth daily as needed (pain).    . Cholecalciferol (DIALYVITE VITAMIN D 5000) 125 MCG (5000 UT) capsule Take 5,000 Units by mouth daily.    . Ibuprofen-diphenhydrAMINE HCl (ADVIL PM) 200-25 MG CAPS Take 1 tablet by mouth See admin instructions. Take 1 tablet every other night    . Melatonin 10 MG TABS Take 10 mg by mouth See admin instructions. Take 10 mg by mouth every other night    . pantoprazole (PROTONIX) 40 MG tablet Take 40 mg by mouth daily.    . Simethicone (GAS-X EXTRA STRENGTH) 125 MG CAPS Take 125 mg by mouth in the morning and at bedtime.      Results for orders placed or performed during the hospital encounter of 08/17/19 (from the past 48 hour(s))  SARS CORONAVIRUS 2 (TAT 6-24 HRS) Nasopharyngeal Nasopharyngeal Swab     Status: None   Collection Time: 08/17/19  1:45 PM   Specimen: Nasopharyngeal Swab  Result Value Ref Range   SARS Coronavirus 2 NEGATIVE NEGATIVE    Comment: (NOTE) SARS-CoV-2 target nucleic acids are NOT DETECTED.  The SARS-CoV-2 RNA is generally detectable in upper and lower respiratory specimens during the acute phase of infection. Negative results do not preclude SARS-CoV-2 infection, do not rule out co-infections with other pathogens, and should not be used as the sole basis for treatment or other Jacob Douglas management decisions. Negative results must be combined with clinical observations, Jacob Douglas history, and epidemiological information. The expected result is Negative.  Fact Sheet for Patients: SugarRoll.be  Fact Sheet for Healthcare Providers: https://www.woods-mathews.com/  This test is not yet approved or cleared by the [place] FDA and  has been authorized for detection and/or diagnosis of SARS-CoV-2 by FDA under an Emergency Use Authorization (EUA). This EUA will remain  in effect (meaning this test can be used) for  the duration of the COVID-19 declaration under Se ction 564(b)(1) of the Act, 21 U.S.C. section 360bbb-3(b)(1), unless the authorization is terminated or revoked sooner.  Performed at [facility], [place] [address], [place], [place] [address]    No results found.  Review of Systems  Blood pressure (!) 132/93, pulse 68, temperature 97.8 F (36.6 C), temperature source Oral, resp. rate 16, height 5\' 9"  (1.753 m), SpO2 96 %. Physical Exam HENT:     Mouth/Throat:     Mouth: Mucous membranes are moist.     Pharynx: Oropharynx is clear.  Eyes:     General: No scleral icterus.    Conjunctiva/sclera: Conjunctivae normal.  Cardiovascular:     Rate and Rhythm: Normal rate and regular rhythm.     Heart sounds: Normal heart sounds. No murmur heard.   Pulmonary:     Effort: Pulmonary effort is normal.     Breath sounds: Normal breath sounds.  Abdominal:     Comments: Abdomen is full but soft and nontender with organomegaly or masses.  Musculoskeletal:        General: No swelling.  Skin:  General: Skin is warm and dry.  Neurological:     Mental Status: He is alert.  Psychiatric:        Mood and Affect: Mood normal.      Assessment/Plan History of colonic adenomas. Surveillance colonoscopy.  Hildred Laser, MD 08/19/2019, 7:33 AM

## 2019-08-19 NOTE — Op Note (Addendum)
[facility] Patient Name: Jacob Douglas Procedure Date: 08/19/2019 7:09 AM MRN: [id number] Date of Birth: [birth year] Attending MD: Hildred Laser , MD CSN: [id number] Age: 59 Admit Type: Outpatient Procedure:                Colonoscopy Indications:              High risk colon cancer surveillance: Personal                            history of colonic polyps Providers:                Hildred Laser, MD, Crystal Page, Randa Spike,                            Technician Referring MD:             Asencion Noble, MD Medicines:                Meperidine 75 mg IV, Midazolam 10 mg IV Complications:            No immediate complications. Estimated Blood Loss:     Estimated blood loss was minimal. Procedure:                Pre-Anesthesia Assessment:                           - Prior to the procedure, a History and Physical                            was performed, and patient medications and                            allergies were reviewed. The patient's tolerance of                            previous anesthesia was also reviewed. The risks                            and benefits of the procedure and the sedation                            options and risks were discussed with the patient.                            All questions were answered, and informed consent                            was obtained. Prior Anticoagulants: The patient has                            taken no previous anticoagulant or antiplatelet                            agents except for aspirin. ASA Grade Assessment: II                            -  A patient with mild systemic disease. After                            reviewing the risks and benefits, the patient was                            deemed in satisfactory condition to undergo the                            procedure.                           After obtaining informed consent, the colonoscope                            was passed under direct vision.  Throughout the                            procedure, the patient's blood pressure, pulse, and                            oxygen saturations were monitored continuously. The                            [id number]-[id number] ([id number]) scope was introduced through                            the anus and advanced to the the cecum, identified                            by appendiceal orifice and ileocecal valve.                            appendiceal orifice was seen but picture not taken.                            The colonoscopy was technically difficult and                            complex due to a redundant colon. Successful                            completion of the procedure was aided by increasing                            the dose of sedation medication, changing the                            patient to a supine position and scope guide. The                            patient tolerated the procedure well. The quality  of the bowel preparation was adequate. The                            ileocecal valve and the rectum were photographed. Scope In: 7:45:01 AM Scope Out: 8:15:57 AM Scope Withdrawal Time: 0 hours 14 minutes 32 seconds  Total Procedure Duration: 0 hours 30 minutes 56 seconds  Findings:      The perianal and digital rectal examinations were normal.      A 5 mm polyp was found in the hepatic flexure. The polyp was removed       with a cold snare. Resection and retrieval were complete. The pathology       specimen was placed into Bottle Number 1.      A small polyp was found in the distal transverse colon. The polyp was       sessile. Biopsies were taken with a cold forceps for histology. The       pathology specimen was placed into Bottle Number 1.      Internal hemorrhoids were found during retroflexion. The hemorrhoids       were small. Impression:               - One 5 mm polyp at the hepatic flexure, removed                            with a cold  snare. Resected and retrieved.                           - One small polyp in the distal transverse colon.                            Biopsied.                           - Internal hemorrhoids. Moderate Sedation:      Moderate (conscious) sedation was administered by the endoscopy nurse       and supervised by the endoscopist. The following parameters were       monitored: oxygen saturation, heart rate, blood pressure, CO2       capnography and response to care. Total physician intraservice time was       38 minutes. Recommendation:           - Patient has a contact number available for                            emergencies. The signs and symptoms of potential                            delayed complications were discussed with the                            patient. Return to normal activities tomorrow.                            Written discharge instructions were provided to the  patient.                           - Resume previous diet today.                           - Continue present medications.                           - No aspirin, ibuprofen, naproxen, or other                            non-steroidal anti-inflammatory drugs for 1 day.                           - Await pathology results.                           - Repeat colonoscopy in 5 years for surveillance. Procedure Code(s):        --- Professional ---                           [phone], Colonoscopy, flexible; with removal of                            tumor(s), polyp(s), or other lesion(s) by snare                            technique                           [id number], 59, Colonoscopy, flexible; with biopsy,                            single or multiple                           99153, Moderate sedation; each additional 15                            minutes intraservice time                           99153, Moderate sedation; each additional 15                            minutes intraservice  time                           G0500, Moderate sedation services provided by the                            same physician or other qualified health care                            professional performing a gastrointestinal  endoscopic service that sedation supports,                            requiring the presence of an independent trained                            observer to assist in the monitoring of the                            patient's level of consciousness and physiological                            status; initial 15 minutes of intra-service time;                            patient age 56 years or older (additional time may                            be reported with [phone], as appropriate) Diagnosis Code(s):        --- Professional ---                           Z86.010, Personal history of colonic polyps                           K63.5, Polyp of colon                           K64.8, Other hemorrhoids CPT copyright 2019 American Medical Association. All rights reserved. The codes documented in this report are preliminary and upon coder review may  be revised to meet current compliance requirements. Hildred Laser, MD Hildred Laser, MD 08/19/2019 8:27:27 AM This report has been signed electronically. Number of Addenda: 0

## 2019-08-19 NOTE — Discharge Instructions (Signed)
No aspirin or NSAIDs for 24 hours. Resume other medications as before. Resume usual diet. No driving for 24 hours. Physician will call with biopsy results.      Colonoscopy, Adult, Care After This sheet gives you information about how to care for yourself after your procedure. Your doctor may also give you more specific instructions. If you have problems or questions, call your doctor. What can I expect after the procedure? After the procedure, it is common to have:  A small amount of blood in your poop (stool) for 24 hours.  Some gas.  Mild cramping or bloating in your belly (abdomen). Follow these instructions at home: Eating and drinking   Drink enough fluid to keep your pee (urine) pale yellow.  Follow instructions from your doctor about what you cannot eat or drink.  Return to your normal diet as told by your doctor. Avoid heavy or fried foods that are hard to digest. Activity  Rest as told by your doctor.  Do not sit for a long time without moving. Get up to take short walks every 1-2 hours. This is important. Ask for help if you feel weak or unsteady.  Return to your normal activities as told by your doctor. Ask your doctor what activities are safe for you. To help cramping and bloating:   Try walking around.  Put heat on your belly as told by your doctor. Use the heat source that your doctor recommends, such as a moist heat pack or a heating pad. ? Put a towel between your skin and the heat source. ? Leave the heat on for 20-30 minutes. ? Remove the heat if your skin turns bright red. This is very important if you are unable to feel pain, heat, or cold. You may have a greater risk of getting burned. General instructions  For the first 24 hours after the procedure: ? Do not drive or use machinery. ? Do not sign important documents. ? Do not drink alcohol. ? Do your daily activities more slowly than normal. ? Eat foods that are soft and easy to  digest.  Take over-the-counter or prescription medicines only as told by your doctor.  Keep all follow-up visits as told by your doctor. This is important. Contact a doctor if:  You have blood in your poop 2-3 days after the procedure. Get help right away if:  You have more than a small amount of blood in your poop.  You see large clumps of tissue (blood clots) in your poop.  Your belly is swollen.  You feel like you may vomit (nauseous).  You vomit.  You have a fever.  You have belly pain that gets worse, and medicine does not help your pain. Summary  After the procedure, it is common to have a small amount of blood in your poop. You may also have mild cramping and bloating in your belly.  For the first 24 hours after the procedure, do not drive or use machinery, do not sign important documents, and do not drink alcohol.  Get help right away if you have a lot of blood in your poop, feel like you may vomit, have a fever, or have more belly pain. This information is not intended to replace advice given to you by your health care provider. Make sure you discuss any questions you have with your health care provider. Document Revised: 08/18/2018 Document Reviewed: 08/18/2018 Elsevier Patient Education  [place].     Colon Polyps  Polyps are  tissue growths inside the body. Polyps can grow in many places, including the large intestine (colon). A polyp may be a round bump or a mushroom-shaped growth. You could have one polyp or several. Most colon polyps are noncancerous (benign). However, some colon polyps can become cancerous over time. Finding and removing the polyps early can help prevent this. What are the causes? The exact cause of colon polyps is not known. What increases the risk? You are more likely to develop this condition if you:  Have a family history of colon cancer or colon polyps.  Are older than 79 or older than 45 if you are African  American.  Have inflammatory bowel disease, such as ulcerative colitis or Crohn's disease.  Have certain hereditary conditions, such as: ? Familial adenomatous polyposis. ? Lynch syndrome. ? Turcot syndrome. ? Peutz-Jeghers syndrome.  Are overweight.  Smoke cigarettes.  Do not get enough exercise.  Drink too much alcohol.  Eat a diet that is high in fat and red meat and low in fiber.  Had childhood cancer that was treated with abdominal radiation. What are the signs or symptoms? Most polyps do not cause symptoms. If you have symptoms, they may include:  Blood coming from your rectum when having a bowel movement.  Blood in your stool. The stool may look dark red or black.  Abdominal pain.  A change in bowel habits, such as constipation or diarrhea. How is this diagnosed? This condition is diagnosed with a colonoscopy. This is a procedure in which a lighted, flexible scope is inserted into the anus and then passed into the colon to examine the area. Polyps are sometimes found when a colonoscopy is done as part of routine cancer screening tests. How is this treated? Treatment for this condition involves removing any polyps that are found. Most polyps can be removed during a colonoscopy. Those polyps will then be tested for cancer. Additional treatment may be needed depending on the results of testing. Follow these instructions at home: Lifestyle  Maintain a healthy weight, or lose weight if recommended by your health care provider.  Exercise every day or as told by your health care provider.  Do not use any products that contain nicotine or tobacco, such as cigarettes and e-cigarettes. If you need help quitting, ask your health care provider.  If you drink alcohol, limit how much you have: ? 0-1 drink a day for women. ? 0-2 drinks a day for men.  Be aware of how much alcohol is in your drink. In the [place] one drink equals one 12 oz bottle of beer (355 mL), one 5 oz glass  of wine (148 mL), or one 1 oz shot of hard liquor (44 mL). Eating and drinking   Eat foods that are high in fiber, such as fruits, vegetables, and whole grains.  Eat foods that are high in calcium and vitamin D, such as milk, cheese, yogurt, eggs, liver, fish, and broccoli.  Limit foods that are high in fat, such as fried foods and desserts.  Limit the amount of red meat and processed meat you eat, such as hot dogs, sausage, bacon, and lunch meats. General instructions  Keep all follow-up visits as told by your health care provider. This is important. ? This includes having regularly scheduled colonoscopies. ? Talk to your health care provider about when you need a colonoscopy. Contact a health care provider if:  You have new or worsening bleeding during a bowel movement.  You have new or increased  blood in your stool.  You have a change in bowel habits.  You lose weight for no known reason. Summary  Polyps are tissue growths inside the body. Polyps can grow in many places, including the colon.  Most colon polyps are noncancerous (benign), but some can become cancerous over time.  This condition is diagnosed with a colonoscopy.  Treatment for this condition involves removing any polyps that are found. Most polyps can be removed during a colonoscopy. This information is not intended to replace advice given to you by your health care provider. Make sure you discuss any questions you have with your health care provider. Document Revised: 05/09/2017 Document Reviewed: 05/09/2017 Elsevier Patient Education  [place].

## 2019-08-20 LAB — SURGICAL PATHOLOGY

## 2019-08-25 ENCOUNTER — Encounter (HOSPITAL_COMMUNITY): Payer: Self-pay | Admitting: Internal Medicine

## 2020-06-21 ENCOUNTER — Ambulatory Visit (INDEPENDENT_AMBULATORY_CARE_PROVIDER_SITE_OTHER): Payer: 59 | Admitting: Internal Medicine

## 2020-06-21 ENCOUNTER — Encounter (INDEPENDENT_AMBULATORY_CARE_PROVIDER_SITE_OTHER): Payer: Self-pay | Admitting: Internal Medicine

## 2020-11-13 IMAGING — MR MRI LUMBAR SPINE WITHOUT AND WITH CONTRAST
4 of 7 series · 17 of 48 positions shown · IV contrast (multihance)
Comparison: None.

CLINICAL DATA: Low back and right buttock pain. History of previous
lumbar surgery.

EXAM:
MRI LUMBAR SPINE WITHOUT AND WITH CONTRAST
TECHNIQUE: Multiplanar and multiecho pulse sequences of the lumbar spine were
obtained without and with intravenous contrast.
CONTRAST:  16mL MULTIHANCE GADOBENATE DIMEGLUMINE 529 MG/ML IV SOLN

[Series 6: T1 · sagittal · 4.0mm · 0.73mm/px · 3 of 15 slices shown (1 of 2)]
[im 1/15]
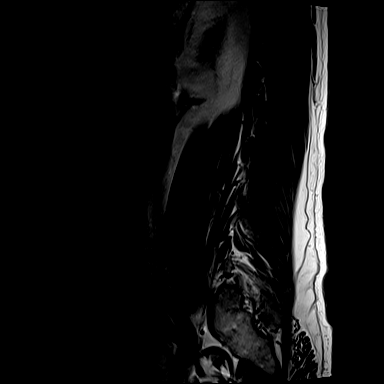
[im 8/15]
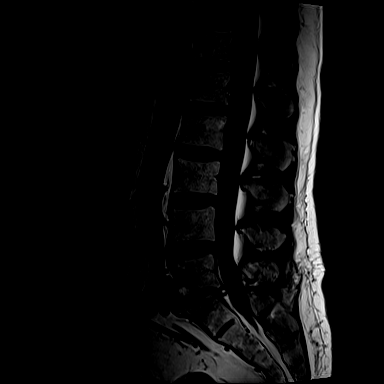
[im 15/15]
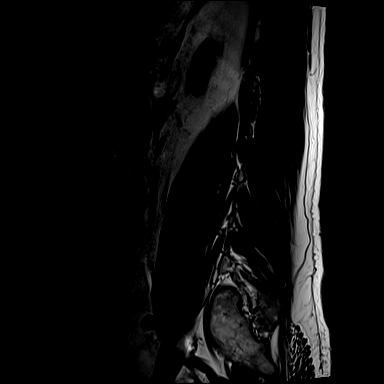

[Series 10: T1 · axial · 4.0mm · 0.28mm/px · z∈[-14,+151]mm · 3 of 40 slices shown (2 of 2)]
[im 4/40]
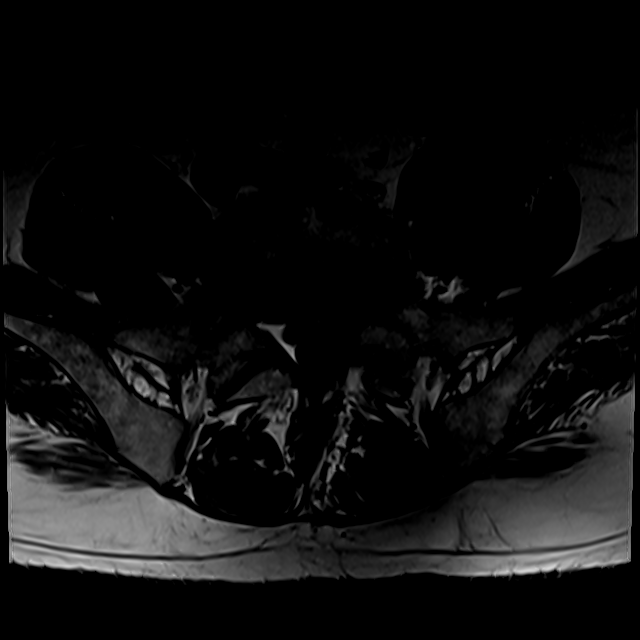
[im 20/40]
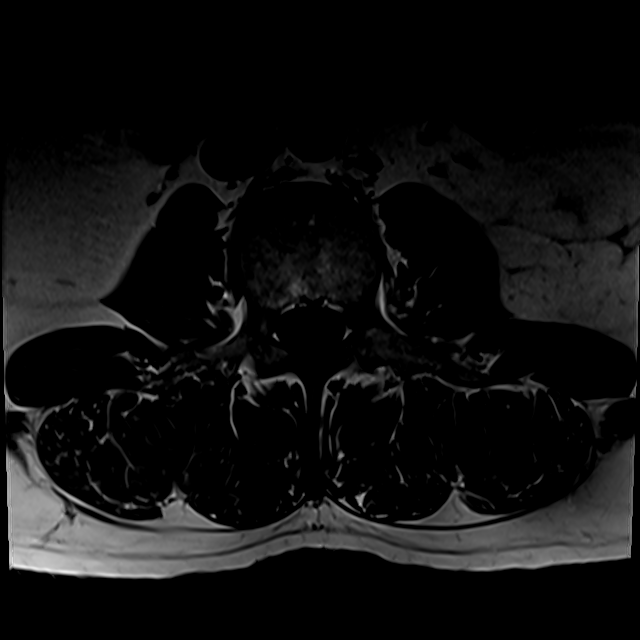
[im 36/40]
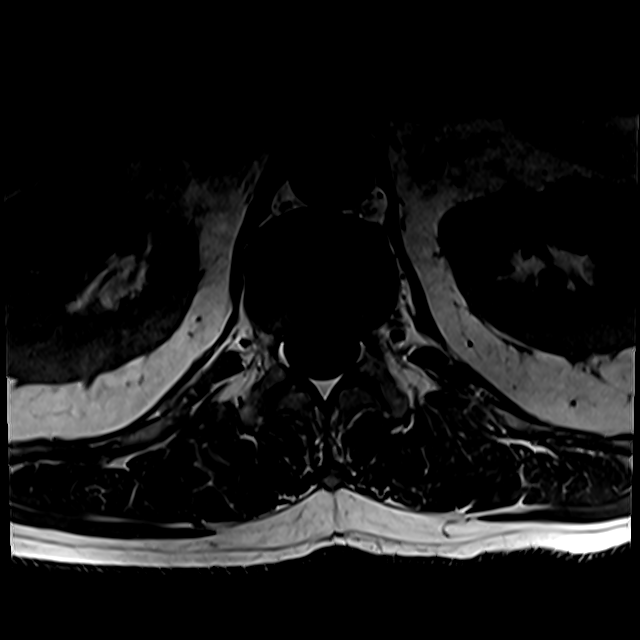

[Series 11: T2 · sagittal · 4.0mm · 0.73mm/px · 4 of 15 slices shown (1 of 2)]
[im 1/15]
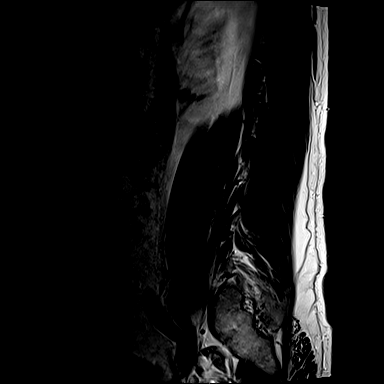
[im 5/15]
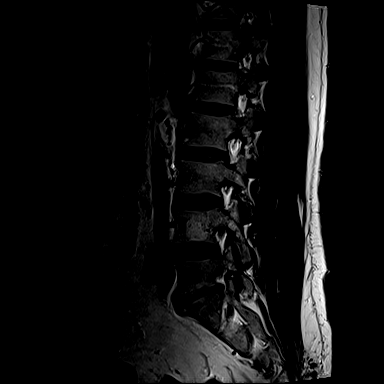
[im 10/15]
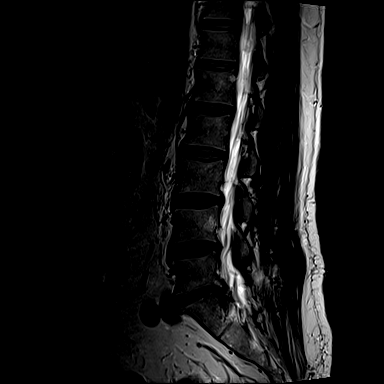
[im 15/15]
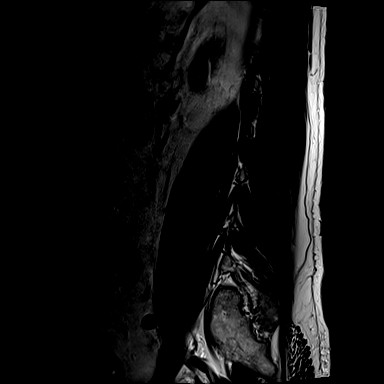

[Series 14: T2 · axial · 4.0mm · 0.28mm/px · z∈[-29,+151]mm · 7 of 40 slices shown (2 of 2)]
[im 1/40]
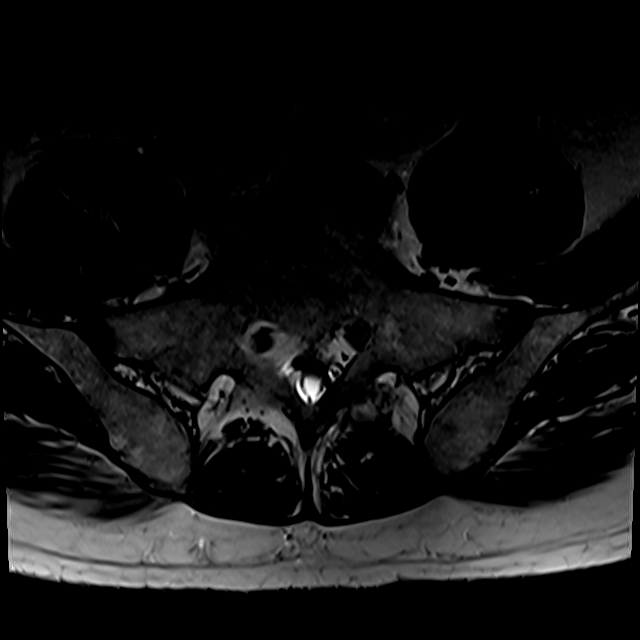
[im 4/40]
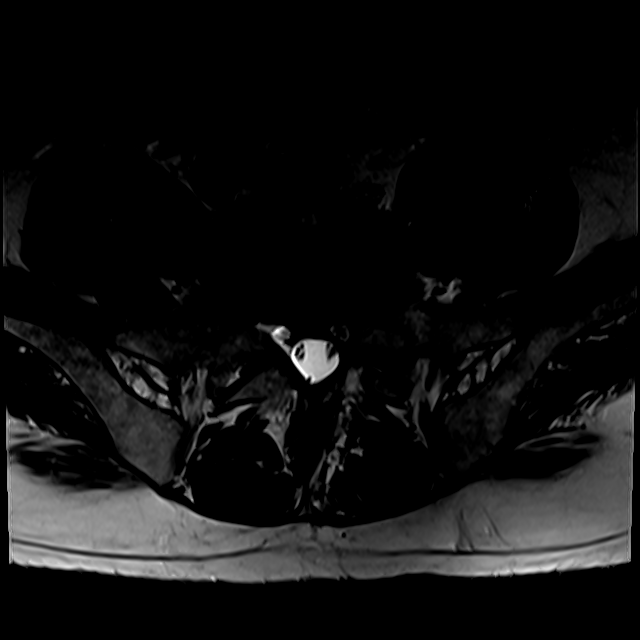
[im 8/40]
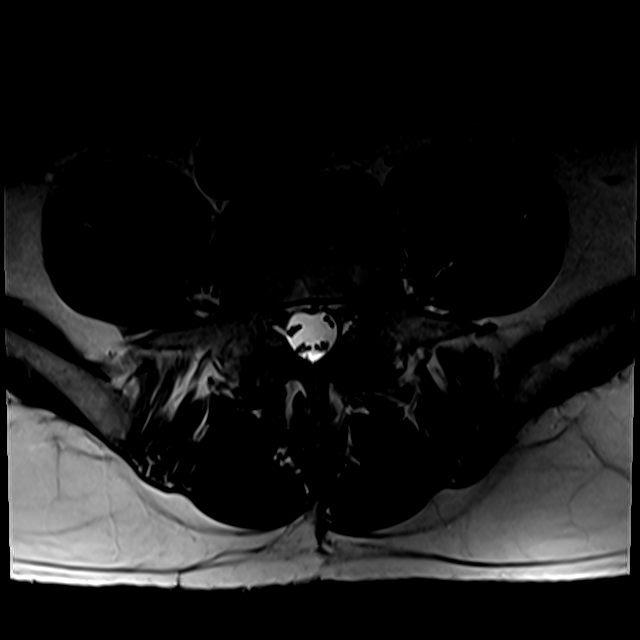
[im 12/40]
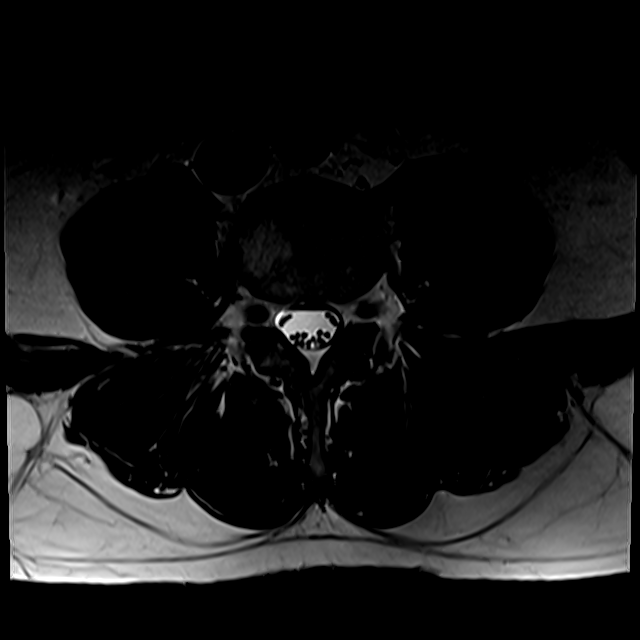
[im 16/40]
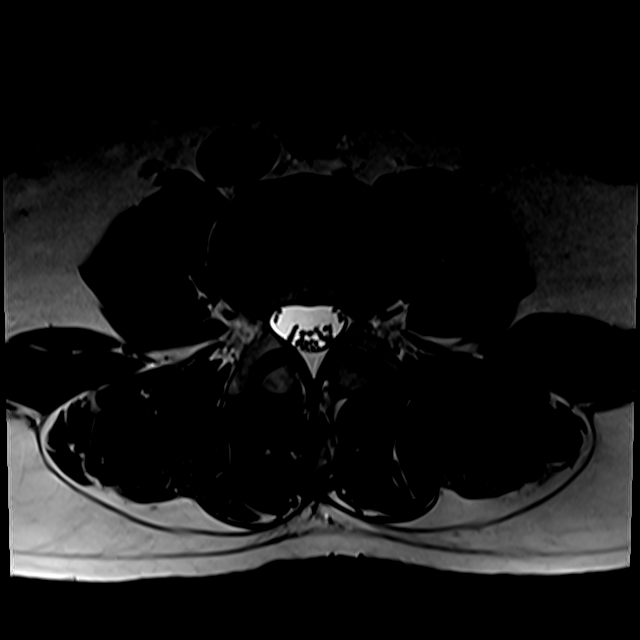
[im 20/40]
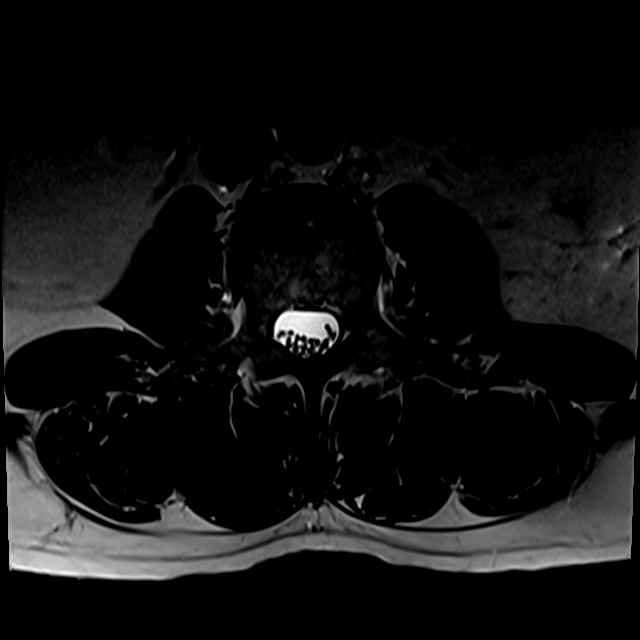
[im 36/40]
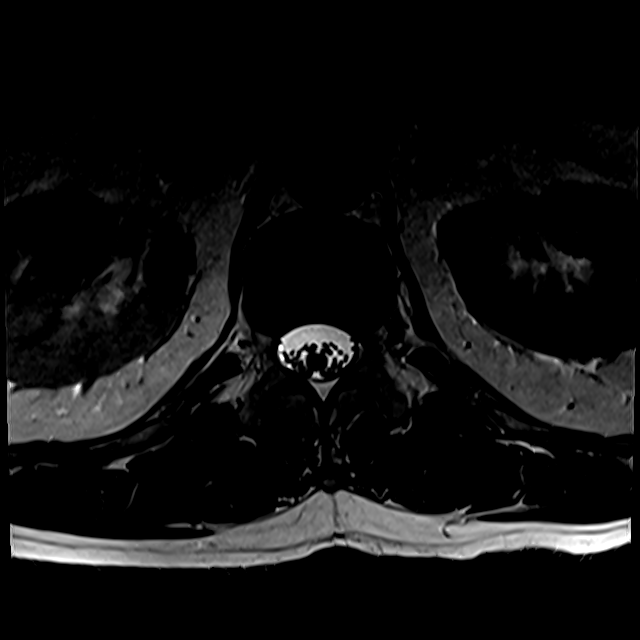

[17 of 48 positions shown; findings below may reference images not displayed]

FINDINGS: Segmentation: There are five lumbar type vertebral bodies. The last
full intervertebral disc space is labeled L5-S1.

Alignment:  Normal

Vertebrae:  Normal marrow signal.  No bone lesions or fracture

Conus medullaris and cauda equina: Conus extends to the top of L1
level. Conus and cauda equina appear normal.

Paraspinal and other soft tissues: No significant findings.

Disc levels:

L1-2: No significant findings.

L2-3: No significant findings.

L3-4: No significant findings.

L4-5: Mild annular bulge with slight flattening of the ventral
thecal sac but no focal disc protrusion, spinal or foraminal
stenosis.

L5-S1: Postoperative changes with a left-sided laminectomy defect.
There is enhancing granulation tissue around the left side of the
thecal sac and around the left S1 nerve root which also demonstrates
slight enhancement which may suggest neuritis. There is moderate
disc space narrowing and osteophytic spurring but I do not see a
recurrent disc protrusion. No foraminal stenosis.
IMPRESSION: 1. Postoperative changes at L5-S1 with degenerate disc disease and
posterior osteophytic ridging. There is enhancing epidural fibrosis
around the left side of the thecal sac and around the left S1 nerve
root. No evidence for recurrent disc protrusion.
2. I do not see any definite findings to account for the patient's
right leg symptoms. The other intervertebral disc spaces are
maintained. No foraminal lesions.

## 2020-11-15 ENCOUNTER — Encounter (INDEPENDENT_AMBULATORY_CARE_PROVIDER_SITE_OTHER): Payer: Self-pay | Admitting: Internal Medicine

## 2020-11-15 ENCOUNTER — Ambulatory Visit (INDEPENDENT_AMBULATORY_CARE_PROVIDER_SITE_OTHER): Payer: 59 | Admitting: Internal Medicine

## 2020-11-15 ENCOUNTER — Other Ambulatory Visit: Payer: Self-pay

## 2020-11-15 VITALS — BP 118/74 | HR 74 | Temp 98.8°F | Ht 69.0 in | Wt 191.0 lb

## 2020-11-15 DIAGNOSIS — K317 Polyp of stomach and duodenum: Secondary | ICD-10-CM | POA: Diagnosis not present

## 2020-11-15 DIAGNOSIS — K219 Gastro-esophageal reflux disease without esophagitis: Secondary | ICD-10-CM | POA: Diagnosis not present

## 2020-11-15 NOTE — Patient Instructions (Signed)
Starting in January 2023 skip Pantoprazole every 3rd day. Can take OTC Pepcid 20 mg on off days as well as OTC antacids.

## 2020-11-15 NOTE — Progress Notes (Signed)
Presenting complaint;  Follow for chronic GERD. History of gastric polyps.  Database and subjective:  Patient is 60 year old Caucasian male who has chronic GERD and history of gastric polyps who is here for scheduled visit.  He was last seen in May 2021. He had EGD in June 2014 July 2016 and September 2019. 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 7 of the largest polyps are snared on EGD of September 2019 and these are fundic gland polyps.  He had large tubular adenoma removed piecemeal from ileocecal valve when he had for screening exam in April 2013. Colonoscopy in July 2016 revealed no residual polyp at ileocecal valve. Colonoscopy in July 2021 revealed 2 small polyps and these are tubular adenomas.  Patient says he is doing well with daily pantoprazole.  He tried taking it every other day but it did not work.  He remains very concerned about gastric polyps.  He has had multiple of these removed.  He wants to know when he should have next EGD. He has good appetite.  His weight has been stable.  He denies nausea vomiting dysphagia hoarseness chronic cough or sore throat.  Bowels move daily.  He denies melena or rectal bleeding. Family history is negative for CRC. He states he takes [facility] once or twice a month and Advil 2-3 times in a week for musculoskeletal pain.  He always takes it with food or snack.   Current Medications: Outpatient Encounter Medications as of 11/15/2020  Medication Sig   ALPRAZolam (XANAX) 0.5 MG tablet Take 0.5 mg by mouth daily as needed for anxiety.    amLODipine-benazepril (LOTREL) 10-40 MG capsule Take 1 capsule by mouth daily.    Aspirin-Caffeine (BC FAST PAIN RELIEF PO) Take 1 packet by mouth daily as needed (pain).   Cholecalciferol (DIALYVITE VITAMIN D 5000) 125 MCG (5000 UT) capsule Take 5,000 Units by mouth daily.    Ibuprofen-diphenhydrAMINE HCl (ADVIL PM) 200-25 MG CAPS Take 1 tablet by mouth See admin instructions. Take 1 tablet every other night   Melatonin 10 MG TABS Take 10 mg by mouth See admin instructions. Take 10 mg by mouth every other night   pantoprazole (PROTONIX) 40 MG tablet Take 40 mg by mouth daily.   Simethicone 125 MG CAPS Take 125 mg by mouth in the morning and at bedtime.   No facility-administered encounter medications on file as of 11/15/2020.     Objective: Blood pressure 118/74, pulse 74, temperature 98.8 F (37.1 C), temperature source Oral, height 5\' 9"  (1.753 m), weight 191 lb (86.6 kg). Patient is alert and in no acute distress Conjunctiva is pink. Sclera is nonicteric Oropharyngeal mucosa is normal. No neck masses or thyromegaly noted. Cardiac exam with regular rhythm normal S1 and S2. No murmur or gallop noted. Lungs are clear to auscultation. Abdomen is full soft and nontender with organomegaly or masses. No LE edema or clubbing noted.  Labs/studies Results:  No recent lab data on file.   Assessment:  #1.  Chronic GERD.  He is doing well with antireflux measures and daily pantoprazole.  He tried taking every other day and it did not work.  He can try skipping the dose every third day and see if he does well.  On off days he can take famotidine OTC 20 mg or antacid.  #2.  History of gastric  polyps.  He has had more than 10 polyps snared and these are fundic gland polyps without any concerning features.  As there is causal relationship to PPI but he is not able to get off PPI.  There are no clear-cut guidelines on follow-up of these polyps.  He may consider having another EGD when he has next colonoscopy in 2026.   Plan:  Starting in January 2023 he will try skipping pantoprazole dose every third day and use Pepcid OTC 20 mg or antacid as on off days for breakthrough symptoms. If this approach does not work he can go back to taking pantoprazole daily. Office  visit in 6 months.

## 2021-05-23 ENCOUNTER — Ambulatory Visit (INDEPENDENT_AMBULATORY_CARE_PROVIDER_SITE_OTHER): Payer: 59 | Admitting: Internal Medicine

## 2021-05-23 ENCOUNTER — Encounter (INDEPENDENT_AMBULATORY_CARE_PROVIDER_SITE_OTHER): Payer: Self-pay | Admitting: Internal Medicine

## 2021-05-23 VITALS — BP 115/70 | HR 73 | Temp 98.0°F | Ht 69.0 in | Wt 187.0 lb

## 2021-05-23 DIAGNOSIS — K219 Gastro-esophageal reflux disease without esophagitis: Secondary | ICD-10-CM | POA: Diagnosis not present

## 2021-05-23 DIAGNOSIS — K317 Polyp of stomach and duodenum: Secondary | ICD-10-CM | POA: Diagnosis not present

## 2021-05-23 DIAGNOSIS — Z86010 Personal history of colonic polyps: Secondary | ICD-10-CM

## 2021-05-23 NOTE — Patient Instructions (Signed)
Notify if pantoprazole stops working or if you have swallowing difficulty. ?Notify if you have rectal bleeding or tarry stool. ? ?Consider dropping pantoprazole dose to once your weight is down to 170 pounds. ?You can skip pantoprazole every fourth day for few weeks and then skip every third day and eventually try every other day. ?

## 2021-05-23 NOTE — Progress Notes (Signed)
Presenting complaint; ? ?Follow-up for chronic GERD. ? ?Database and subjective: ? ?Patient is 61 year old Caucasian male who is here for scheduled visit.  He has chronic GERD history of gastric polyps/fundic gland polyps and history of colonic polyps/adenomas. ?His last EGD was in September 2019 and last colonoscopy was in July 2021 with removal of 2 small tubular adenomas.  He has had adenomas on prior colonoscopy as well. ?He was last seen in the office 6 months ago. ?He states heartburn is well controlled with PPI.  He did try every other day but did not work.  He denies nausea vomiting dysphagia hoarseness chronic cough or sore throat.  He also denies abdominal pain.  Bowels move daily.  No melena or rectal bleeding. ?He takes BC powder no more than 2 or 3 times a month for headache.  He does take Advil 200 mg usually every other day. ?Patient states he has been fasting since January this year.  He and his wife asked between noon and 7 PM.  He has lost 4 pounds.  His goal is to get down 270 pounds. ? ?Current Medications: ?Outpatient Encounter Medications as of 05/23/2021  ?Medication Sig  ? ALPRAZolam (XANAX) 0.5 MG tablet Take 0.5 mg by mouth daily as needed for anxiety.   ? amLODipine-benazepril (LOTREL) 10-40 MG capsule Take 1 capsule by mouth daily.   ? Aspirin-Caffeine (BC FAST PAIN RELIEF PO) Take 1 packet by mouth daily as needed (pain).  ? Cholecalciferol (DIALYVITE VITAMIN D 5000) 125 MCG (5000 UT) capsule Take 5,000 Units by mouth daily.  ? Ibuprofen-diphenhydrAMINE HCl (ADVIL PM) 200-25 MG CAPS Take 1 tablet by mouth See admin instructions. Take 1 tablet every other night  ? Melatonin 10 MG TABS Take 10 mg by mouth See admin instructions. Take 10 mg by mouth every other night  ? pantoprazole (PROTONIX) 40 MG tablet Take 40 mg by mouth daily.  ? Simethicone 125 MG CAPS Take 125 mg by mouth in the morning and at bedtime.  ? ?No facility-administered encounter medications on file as of 05/23/2021.   ? ? ? ?Objective: ?Blood pressure 115/70, pulse 73, temperature 98 ?F (36.7 ?C), temperature source Oral, height '5\' 9"'  (1.753 m), weight 187 lb (84.8 kg). ?Patient is alert and in no acute distress. ?Conjunctiva is pink. Sclera is nonicteric ?Oropharyngeal mucosa is normal. ?No neck masses or thyromegaly noted. ?Cardiac exam with regular rhythm normal S1 and S2. No murmur or gallop noted. ?Lungs are clear to auscultation. ?Abdomen is symmetrical soft and nontender with organomegaly or masses. ?No LE edema or clubbing noted. ? ?Labs/studies Results: ? ? ? ?  Latest Ref Rng & Units 04/08/2012  ?  5:37 AM 04/07/2012  ?  6:02 AM 04/06/2012  ?  5:28 AM  ?CBC  ?WBC 4.0 - 10.5 K/uL 12.5   12.7   10.7    ?Hemoglobin 13.0 - 17.0 g/dL 12.7   12.7   11.7    ?Hematocrit 39.0 - 52.0 % 37.3   37.3   34.6    ?Platelets 150 - 400 K/uL 472   408   316    ?  ? ?  Latest Ref Rng & Units 04/08/2012  ?  5:37 AM 04/07/2012  ?  6:02 AM 04/06/2012  ?  5:28 AM  ?CMP  ?Glucose 70 - 99 mg/dL  130   117    ?BUN 6 - 23 mg/dL  5   9    ?Creatinine 0.50 - 1.35 mg/dL  0.67  0.84    ?Sodium 135 - 145 mEq/L  134   137    ?Potassium 3.5 - 5.1 mEq/L  3.2   3.6    ?Chloride 96 - 112 mEq/L  94   97    ?CO2 19 - 32 mEq/L  29   30    ?Calcium 8.4 - 10.5 mg/dL  8.9   8.8    ?Total Protein 6.0 - 8.3 g/dL 6.9   6.7   6.3    ?Total Bilirubin 0.3 - 1.2 mg/dL 4.9   6.5   5.6    ?Alkaline Phos 39 - 117 U/L 558   534   413    ?AST 0 - 37 U/L 153   197   108    ?ALT 0 - 53 U/L 280   241   159    ?  ? ?  Latest Ref Rng & Units 04/08/2012  ?  5:37 AM 04/07/2012  ?  6:02 AM 04/06/2012  ?  5:28 AM  ?Hepatic Function  ?Total Protein 6.0 - 8.3 g/dL 6.9   6.7   6.3    ?Albumin 3.5 - 5.2 g/dL 2.5   2.7   2.4    ?AST 0 - 37 U/L 153   197   108    ?ALT 0 - 53 U/L 280   241   159    ?Alk Phosphatase 39 - 117 U/L 558   534   413    ?Total Bilirubin 0.3 - 1.2 mg/dL 4.9   6.5   5.6    ?Bilirubin, Direct 0.0 - 0.3 mg/dL 3.1   4.7   4.4    ?  ? ? ?Assessment: ? ?#1.  Chronic GERD.  Patient is  presently on pantoprazole 40 mg daily.  He did not tolerate dose every other day.  Dose reduction was tried because of history of gastric polyps.  He is working on losing weight.  His goal is to get his weight down 270 pounds.  If he is successful he can try dropping pantoprazole dose to every other day or skip a dose every third or fourth day. ?It is reassuring to know that he does not have Barrett's esophagus based on his EGD of September 2019. ? ?#2.  History of colonic adenomas.  Last colonoscopy was in July 2021.  Next colonoscopy in July 2026. ? ?#3.  History of gastric polyps.  He had 7 of the largest polyps removed in September 2019 and these are fundic gland polyps.  There are no clear-cut guidelines on follow-up of these lesions.  He did not do well with PPIs dose reduction. ?May consider EGD at time of next colonoscopy or if he has symptoms pertaining to upper GI tract. ? ? ?Plan: ? ?Continue pantoprazole until it stops working. ?Consider dropping pantoprazole dose to every other day when you have lost weight down 270 pounds.  If every other day schedule does not work you can consider dropping it every third or fourth day. ?Call office if you have rectal bleeding or melena. ?Office visit with Dr. Jenetta Downer in 1 year. ? ? ? ? ? ?

## 2022-03-14 ENCOUNTER — Encounter (INDEPENDENT_AMBULATORY_CARE_PROVIDER_SITE_OTHER): Payer: Self-pay | Admitting: Gastroenterology

## 2022-05-24 ENCOUNTER — Ambulatory Visit (INDEPENDENT_AMBULATORY_CARE_PROVIDER_SITE_OTHER): Payer: 59 | Admitting: Gastroenterology

## 2022-05-28 ENCOUNTER — Ambulatory Visit (INDEPENDENT_AMBULATORY_CARE_PROVIDER_SITE_OTHER): Payer: 59 | Admitting: Gastroenterology

## 2024-02-14 ENCOUNTER — Other Ambulatory Visit: Payer: Self-pay | Admitting: Orthopedic Surgery

## 2024-02-17 LAB — SURGICAL PATHOLOGY
